# Patient Record
Sex: Female | Born: 1965 | Race: White | Hispanic: No | Marital: Married | Smoking: Never smoker
Health system: Southern US, Community
[De-identification: ages and names within clinical notes are randomized; demographics above are authoritative.]

## PROBLEM LIST (undated history)

## (undated) DIAGNOSIS — T8859XA Other complications of anesthesia, initial encounter: Secondary | ICD-10-CM

## (undated) DIAGNOSIS — J189 Pneumonia, unspecified organism: Secondary | ICD-10-CM

## (undated) DIAGNOSIS — Z9889 Other specified postprocedural states: Secondary | ICD-10-CM

## (undated) DIAGNOSIS — R112 Nausea with vomiting, unspecified: Secondary | ICD-10-CM

## (undated) DIAGNOSIS — D649 Anemia, unspecified: Secondary | ICD-10-CM

## (undated) DIAGNOSIS — T4145XA Adverse effect of unspecified anesthetic, initial encounter: Secondary | ICD-10-CM

## (undated) DIAGNOSIS — Z86010 Personal history of colonic polyps: Secondary | ICD-10-CM

## (undated) DIAGNOSIS — M069 Rheumatoid arthritis, unspecified: Secondary | ICD-10-CM

## (undated) HISTORY — DX: Personal history of colonic polyps: Z86.010

## (undated) HISTORY — DX: Rheumatoid arthritis, unspecified: M06.9

## (undated) HISTORY — DX: Anemia, unspecified: D64.9

---

## 1990-07-18 HISTORY — PX: DIAGNOSTIC LAPAROSCOPY: SUR761

## 1997-11-24 ENCOUNTER — Other Ambulatory Visit: Admission: RE | Admit: 1997-11-24 | Discharge: 1997-11-24 | Payer: Self-pay | Admitting: Gynecology

## 1998-11-25 ENCOUNTER — Other Ambulatory Visit: Admission: RE | Admit: 1998-11-25 | Discharge: 1998-11-25 | Payer: Self-pay | Admitting: Gynecology

## 1999-12-16 ENCOUNTER — Other Ambulatory Visit: Admission: RE | Admit: 1999-12-16 | Discharge: 1999-12-16 | Payer: Self-pay | Admitting: Gynecology

## 2000-01-26 ENCOUNTER — Other Ambulatory Visit: Admission: RE | Admit: 2000-01-26 | Discharge: 2000-01-26 | Payer: Self-pay | Admitting: Gynecology

## 2001-01-05 ENCOUNTER — Other Ambulatory Visit: Admission: RE | Admit: 2001-01-05 | Discharge: 2001-01-05 | Payer: Self-pay | Admitting: Gynecology

## 2002-03-21 ENCOUNTER — Other Ambulatory Visit: Admission: RE | Admit: 2002-03-21 | Discharge: 2002-03-21 | Payer: Self-pay | Admitting: Obstetrics & Gynecology

## 2002-07-08 ENCOUNTER — Ambulatory Visit (HOSPITAL_COMMUNITY): Admission: RE | Admit: 2002-07-08 | Discharge: 2002-07-08 | Payer: Self-pay | Admitting: Obstetrics and Gynecology

## 2002-07-18 HISTORY — PX: TUBAL LIGATION: SHX77

## 2002-09-25 ENCOUNTER — Inpatient Hospital Stay (HOSPITAL_COMMUNITY): Admission: AD | Admit: 2002-09-25 | Discharge: 2002-09-27 | Payer: Self-pay | Admitting: Obstetrics & Gynecology

## 2002-10-24 ENCOUNTER — Other Ambulatory Visit: Admission: RE | Admit: 2002-10-24 | Discharge: 2002-10-24 | Payer: Self-pay | Admitting: Obstetrics & Gynecology

## 2002-11-07 ENCOUNTER — Ambulatory Visit (HOSPITAL_COMMUNITY): Admission: RE | Admit: 2002-11-07 | Discharge: 2002-11-07 | Payer: Self-pay | Admitting: Obstetrics & Gynecology

## 2004-01-26 ENCOUNTER — Other Ambulatory Visit: Admission: RE | Admit: 2004-01-26 | Discharge: 2004-01-26 | Payer: Self-pay | Admitting: Obstetrics & Gynecology

## 2004-04-29 ENCOUNTER — Ambulatory Visit (HOSPITAL_COMMUNITY): Admission: RE | Admit: 2004-04-29 | Discharge: 2004-04-29 | Payer: Self-pay | Admitting: Family Medicine

## 2005-06-23 ENCOUNTER — Other Ambulatory Visit: Admission: RE | Admit: 2005-06-23 | Discharge: 2005-06-23 | Payer: Self-pay | Admitting: Obstetrics & Gynecology

## 2006-08-29 ENCOUNTER — Encounter: Admission: RE | Admit: 2006-08-29 | Discharge: 2006-08-29 | Payer: Self-pay | Admitting: Sports Medicine

## 2006-10-27 ENCOUNTER — Encounter: Admission: RE | Admit: 2006-10-27 | Discharge: 2006-10-27 | Payer: Self-pay | Admitting: Obstetrics & Gynecology

## 2010-08-11 ENCOUNTER — Encounter
Admission: RE | Admit: 2010-08-11 | Discharge: 2010-08-11 | Payer: Self-pay | Source: Home / Self Care | Attending: Obstetrics & Gynecology | Admitting: Obstetrics & Gynecology

## 2011-05-30 ENCOUNTER — Other Ambulatory Visit: Payer: Self-pay | Admitting: Obstetrics & Gynecology

## 2011-05-30 DIAGNOSIS — R2230 Localized swelling, mass and lump, unspecified upper limb: Secondary | ICD-10-CM

## 2011-06-14 ENCOUNTER — Ambulatory Visit
Admission: RE | Admit: 2011-06-14 | Discharge: 2011-06-14 | Disposition: A | Discharge status: Home / Self Care | Payer: BC Managed Care – PPO | Source: Ambulatory Visit | Attending: Obstetrics & Gynecology | Admitting: Obstetrics & Gynecology

## 2011-06-14 DIAGNOSIS — R2230 Localized swelling, mass and lump, unspecified upper limb: Secondary | ICD-10-CM

## 2012-01-13 ENCOUNTER — Other Ambulatory Visit: Payer: Self-pay | Admitting: Obstetrics & Gynecology

## 2012-01-13 DIAGNOSIS — N6009 Solitary cyst of unspecified breast: Secondary | ICD-10-CM

## 2012-01-20 ENCOUNTER — Other Ambulatory Visit: Payer: BC Managed Care – PPO

## 2012-01-23 ENCOUNTER — Ambulatory Visit
Admission: RE | Admit: 2012-01-23 | Discharge: 2012-01-23 | Disposition: A | Discharge status: Home / Self Care | Payer: BC Managed Care – PPO | Source: Ambulatory Visit | Attending: Obstetrics & Gynecology | Admitting: Obstetrics & Gynecology

## 2012-01-23 DIAGNOSIS — N6009 Solitary cyst of unspecified breast: Secondary | ICD-10-CM

## 2013-04-30 ENCOUNTER — Other Ambulatory Visit: Payer: Self-pay

## 2013-04-30 DIAGNOSIS — Z1231 Encounter for screening mammogram for malignant neoplasm of breast: Secondary | ICD-10-CM

## 2013-05-13 ENCOUNTER — Ambulatory Visit
Admission: RE | Admit: 2013-05-13 | Discharge: 2013-05-13 | Disposition: A | Discharge status: Home / Self Care | Payer: BC Managed Care – PPO | Source: Ambulatory Visit

## 2013-05-13 DIAGNOSIS — Z1231 Encounter for screening mammogram for malignant neoplasm of breast: Secondary | ICD-10-CM

## 2013-11-26 ENCOUNTER — Encounter: Payer: Self-pay | Admitting: Internal Medicine

## 2013-11-26 ENCOUNTER — Other Ambulatory Visit: Payer: Self-pay | Admitting: Obstetrics & Gynecology

## 2013-11-26 DIAGNOSIS — N63 Unspecified lump in unspecified breast: Secondary | ICD-10-CM

## 2013-12-06 ENCOUNTER — Ambulatory Visit
Admission: RE | Admit: 2013-12-06 | Discharge: 2013-12-06 | Disposition: A | Discharge status: Home / Self Care | Payer: BC Managed Care – PPO | Source: Ambulatory Visit | Attending: Obstetrics & Gynecology | Admitting: Obstetrics & Gynecology

## 2013-12-06 ENCOUNTER — Encounter (INDEPENDENT_AMBULATORY_CARE_PROVIDER_SITE_OTHER): Payer: Self-pay

## 2013-12-06 DIAGNOSIS — N63 Unspecified lump in unspecified breast: Secondary | ICD-10-CM

## 2014-01-02 ENCOUNTER — Ambulatory Visit (AMBULATORY_SURGERY_CENTER): Payer: Self-pay | Admitting: *Deleted

## 2014-01-02 VITALS — Ht 65.0 in | Wt 158.4 lb

## 2014-01-02 DIAGNOSIS — Z1211 Encounter for screening for malignant neoplasm of colon: Secondary | ICD-10-CM

## 2014-01-02 MED ORDER — NA SULFATE-K SULFATE-MG SULF 17.5-3.13-1.6 GM/177ML PO SOLN: 1.0000 | Freq: Once | ORAL | Status: DC

## 2014-01-02 NOTE — Progress Notes (Signed)
No allergies to eggs or soy. No problems with anesthesia.  Pt given Emmi instructions for colonoscopy  No oxygen use  No diet drug use  

## 2014-01-16 ENCOUNTER — Encounter: Payer: BC Managed Care – PPO | Admitting: Internal Medicine

## 2014-01-24 ENCOUNTER — Ambulatory Visit (AMBULATORY_SURGERY_CENTER): Payer: BC Managed Care – PPO | Admitting: Internal Medicine

## 2014-01-24 ENCOUNTER — Encounter: Payer: Self-pay | Admitting: Internal Medicine

## 2014-01-24 VITALS — BP 107/76 | HR 78 | Temp 97.1°F | Resp 21 | Ht 65.0 in | Wt 158.0 lb

## 2014-01-24 DIAGNOSIS — Z1211 Encounter for screening for malignant neoplasm of colon: Secondary | ICD-10-CM

## 2014-01-24 DIAGNOSIS — D126 Benign neoplasm of colon, unspecified: Secondary | ICD-10-CM

## 2014-01-24 DIAGNOSIS — D122 Benign neoplasm of ascending colon: Secondary | ICD-10-CM

## 2014-01-24 DIAGNOSIS — D125 Benign neoplasm of sigmoid colon: Secondary | ICD-10-CM

## 2014-01-24 MED ORDER — SODIUM CHLORIDE 0.9 % IV SOLN: 500.0000 mL | INTRAVENOUS | Status: DC

## 2014-01-24 NOTE — Patient Instructions (Addendum)
I found and removed 2 tiny polyps that look benign.  Everything else looks ok.  It sounds to me like you probably have Irritable Bowel Syndrome (IBS).  Try a probiotic every day for 1 month to see if that makes a difference. I recommend one called Align but there are many.  If you have persistent problems you may call my office and get an appointment to see me.  I appreciate the opportunity to care for you. Gatha Mayer, MD, FACG     YOU HAD AN ENDOSCOPIC PROCEDURE TODAY AT [facility]: Refer to the procedure report that was given to you for any specific questions about what was found during the examination.  If the procedure report does not answer your questions, please call your gastroenterologist to clarify.  If you requested that your care partner not be given the details of your procedure findings, then the procedure report has been included in a sealed envelope for you to review at your convenience later.  YOU SHOULD EXPECT: Some feelings of bloating in the abdomen. Passage of more gas than usual.  Walking can help get rid of the air that was put into your GI tract during the procedure and reduce the bloating. If you had a lower endoscopy (such as a colonoscopy or flexible sigmoidoscopy) you may notice spotting of blood in your stool or on the toilet paper. If you underwent a bowel prep for your procedure, then you may not have a normal bowel movement for a few days.  DIET: Your first meal following the procedure should be a light meal and then it is ok to progress to your normal diet.  A half-sandwich or bowl of soup is an example of a good first meal.  Heavy or fried foods are harder to digest and may make you feel nauseous or bloated.  Likewise meals heavy in dairy and vegetables can cause extra gas to form and this can also increase the bloating.  Drink plenty of fluids but you should avoid alcoholic beverages for 24 hours.  ACTIVITY: Your care partner should take  you home directly after the procedure.  You should plan to take it easy, moving slowly for the rest of the day.  You can resume normal activity the day after the procedure however you should NOT DRIVE or use heavy machinery for 24 hours (because of the sedation medicines used during the test).    SYMPTOMS TO REPORT IMMEDIATELY: A gastroenterologist can be reached at any hour.  During normal business hours, 8:30 AM to 5:00 PM Monday through Friday, call [phone].  After hours and on weekends, please call the GI answering service at [phone] who will take a message and have the physician on call contact you.   Following lower endoscopy (colonoscopy or flexible sigmoidoscopy):  Excessive amounts of blood in the stool  Significant tenderness or worsening of abdominal pains  Swelling of the abdomen that is new, acute  Fever of 100F or higher   FOLLOW UP: If any biopsies were taken you will be contacted by phone or by letter within the next 1-3 weeks.  Call your gastroenterologist if you have not heard about the biopsies in 3 weeks.  Our staff will call the home number listed on your records the next business day following your procedure to check on you and address any questions or concerns that you may have at that time regarding the information given to you following your procedure. This is a  courtesy call and so if there is no answer at the home number and we have not heard from you through the emergency physician on call, we will assume that you have returned to your regular daily activities without incident.  SIGNATURES/CONFIDENTIALITY: You and/or your care partner have signed paperwork which will be entered into your electronic medical record.  These signatures attest to the fact that that the information above on your After Visit Summary has been reviewed and is understood.  Full responsibility of the confidentiality of this discharge information lies with you and/or your  care-partner.    Handouts were given to your care partner on polyps and Irritable Bowel Syndrome. You may resume your other current medications today. Await biopsy results. Please call if any questions or concerns.

## 2014-01-24 NOTE — Op Note (Signed)
[place]  [address] [place], [address]   COLONOSCOPY PROCEDURE REPORT  PATIENT: Victoria Singh, Victoria Singh  MR#: [id number] BIRTHDATE: 03-Oct-1965 , 48  yrs. old GENDER: Female ENDOSCOPIST: Gatha Mayer, MD, [facility] REFERRED CW:CBJSEG Nori Riis, M.D. PROCEDURE DATE:  01/24/2014 PROCEDURE:   Colonoscopy with biopsy and snare polypectomy First Screening Colonoscopy - Avg.  risk and is 50 yrs.  old or older Yes.  Prior Negative Screening - Now for repeat screening. N/A  History of Adenoma - Now for follow-up colonoscopy & has been > or = to 3 yrs.  N/A  Polyps Removed Today? Yes. ASA CLASS:   Class II INDICATIONS:average risk screening and first colonoscopy. MEDICATIONS: MAC sedation, administered by CRNA, These medications were titrated to patient response per physician's verbal order, and propofol (Diprivan) 250mg  IV  DESCRIPTION OF PROCEDURE:   After the risks benefits and alternatives of the procedure were thoroughly explained, informed consent was obtained.  A digital rectal exam revealed no abnormalities of the rectum.   The LB [id number]  endoscope was introduced through the anus and advanced to the terminal ileum which was intubated for a short distance. No adverse events experienced.   The quality of the prep was excellent using Suprep The instrument was then slowly withdrawn as the colon was fully examined.  COLON FINDINGS: Two sessile polyps measuring 2 and 4 mm in size were found in the ascending colon and sigmoid colon.  A polypectomy was performed with cold forceps and with a cold snare.  The resection was complete and the polyp tissue was completely retrieved.   The colon mucosa was otherwise normal.   The mucosa appeared normal in the terminal ileum.   A right colon retroflexion was performed. Retroflexed views revealed no abnormalities. The time to cecum=3 minutes 16 seconds.  Withdrawal time=7 minutes 35 seconds.  The scope was withdrawn and the  procedure completed. COMPLICATIONS: There were no complications.  ENDOSCOPIC IMPRESSION: 1.   Two sessile polyps measuring 2 and 4 mm in size were found in the ascending colon and sigmoid colon; polypectomy was performed with cold forceps and with a cold snare 2.   The colon mucosa was otherwise normal - excellent prep 3.   Normal mucosa in the terminal ileum  RECOMMENDATIONS: 1.  Timing of repeat colonoscopy will be determined by pathology findings. 2.   Try probiotic daily x 1 month for what sounds like IBS, may see me if unhelpful  eSigned:  Gatha Mayer, MD, [facility] 01/24/2014 11:39 AM   cc: Evette Cristal, MD and The Patient

## 2014-01-24 NOTE — Progress Notes (Signed)
Procedure ends, to recovery, report given and VSS. 

## 2014-01-24 NOTE — Progress Notes (Signed)
Called to room to assist during endoscopic procedure.  Patient ID and intended procedure confirmed with present staff. Received instructions for my participation in the procedure from the performing physician.  

## 2014-01-27 ENCOUNTER — Telehealth: Payer: Self-pay | Admitting: *Deleted

## 2014-01-27 NOTE — Telephone Encounter (Signed)
  Follow up Call-  Call back number 01/24/2014  Post procedure Call Back phone  # [phone]  Permission to leave phone message Yes     Patient questions:  Message left to call us if necessary.

## 2014-01-30 ENCOUNTER — Encounter: Payer: Self-pay | Admitting: Internal Medicine

## 2014-01-30 DIAGNOSIS — Z860100 Personal history of colon polyps, unspecified: Secondary | ICD-10-CM

## 2014-01-30 HISTORY — DX: Personal history of colonic polyps: Z86.010

## 2014-01-30 HISTORY — DX: Personal history of colon polyps, unspecified: Z86.0100

## 2014-01-30 NOTE — Progress Notes (Signed)
Quick Note:  2 mm adenoma and a 4 mm sigmoid hyperplastic - repeat colon 2022 ______

## 2014-06-16 ENCOUNTER — Other Ambulatory Visit: Payer: Self-pay | Admitting: Obstetrics & Gynecology

## 2014-06-18 LAB — CYTOLOGY - PAP

## 2015-05-26 ENCOUNTER — Other Ambulatory Visit: Payer: Self-pay | Admitting: Obstetrics & Gynecology

## 2015-05-26 DIAGNOSIS — R928 Other abnormal and inconclusive findings on diagnostic imaging of breast: Secondary | ICD-10-CM

## 2015-06-03 ENCOUNTER — Ambulatory Visit
Admission: RE | Admit: 2015-06-03 | Discharge: 2015-06-03 | Disposition: A | Discharge status: Home / Self Care | Payer: BC Managed Care – PPO | Source: Ambulatory Visit | Attending: Obstetrics & Gynecology | Admitting: Obstetrics & Gynecology

## 2015-06-03 DIAGNOSIS — R928 Other abnormal and inconclusive findings on diagnostic imaging of breast: Secondary | ICD-10-CM

## 2016-05-27 ENCOUNTER — Other Ambulatory Visit: Payer: Self-pay | Admitting: Obstetrics & Gynecology

## 2016-05-27 DIAGNOSIS — Z1231 Encounter for screening mammogram for malignant neoplasm of breast: Secondary | ICD-10-CM

## 2016-07-04 ENCOUNTER — Ambulatory Visit
Admission: RE | Admit: 2016-07-04 | Discharge: 2016-07-04 | Disposition: A | Discharge status: Home / Self Care | Payer: BC Managed Care – PPO | Source: Ambulatory Visit | Attending: Obstetrics & Gynecology | Admitting: Obstetrics & Gynecology

## 2016-07-04 DIAGNOSIS — Z1231 Encounter for screening mammogram for malignant neoplasm of breast: Secondary | ICD-10-CM

## 2017-06-14 ENCOUNTER — Other Ambulatory Visit: Payer: Self-pay | Admitting: Obstetrics & Gynecology

## 2017-06-14 DIAGNOSIS — Z139 Encounter for screening, unspecified: Secondary | ICD-10-CM

## 2017-07-12 ENCOUNTER — Ambulatory Visit: Payer: BC Managed Care – PPO

## 2017-07-19 ENCOUNTER — Ambulatory Visit
Admission: RE | Admit: 2017-07-19 | Discharge: 2017-07-19 | Disposition: A | Discharge status: Home / Self Care | Payer: BC Managed Care – PPO | Source: Ambulatory Visit | Attending: Obstetrics & Gynecology | Admitting: Obstetrics & Gynecology

## 2017-07-19 DIAGNOSIS — Z139 Encounter for screening, unspecified: Secondary | ICD-10-CM

## 2017-12-10 ENCOUNTER — Encounter (HOSPITAL_COMMUNITY): Payer: Self-pay

## 2017-12-10 ENCOUNTER — Inpatient Hospital Stay (HOSPITAL_COMMUNITY): Payer: BC Managed Care – PPO

## 2017-12-10 ENCOUNTER — Inpatient Hospital Stay (HOSPITAL_COMMUNITY): Payer: BC Managed Care – PPO | Admitting: Anesthesiology

## 2017-12-10 ENCOUNTER — Inpatient Hospital Stay (HOSPITAL_COMMUNITY)
Admission: EM | Admit: 2017-12-10 | Discharge: 2017-12-14 | DRG: 853 | Disposition: A | Discharge status: Home / Self Care | Payer: BC Managed Care – PPO | Attending: Family Medicine | Admitting: Family Medicine

## 2017-12-10 ENCOUNTER — Emergency Department (HOSPITAL_COMMUNITY): Payer: BC Managed Care – PPO

## 2017-12-10 ENCOUNTER — Encounter (HOSPITAL_COMMUNITY)
Admission: EM | Disposition: A | Discharge status: Home / Self Care | Payer: Self-pay | Source: Home / Self Care | Attending: Urology

## 2017-12-10 DIAGNOSIS — D849 Immunodeficiency, unspecified: Secondary | ICD-10-CM

## 2017-12-10 DIAGNOSIS — J181 Lobar pneumonia, unspecified organism: Secondary | ICD-10-CM | POA: Diagnosis present

## 2017-12-10 DIAGNOSIS — Z419 Encounter for procedure for purposes other than remedying health state, unspecified: Secondary | ICD-10-CM

## 2017-12-10 DIAGNOSIS — J189 Pneumonia, unspecified organism: Secondary | ICD-10-CM | POA: Diagnosis not present

## 2017-12-10 DIAGNOSIS — J9811 Atelectasis: Secondary | ICD-10-CM | POA: Diagnosis present

## 2017-12-10 DIAGNOSIS — R0781 Pleurodynia: Secondary | ICD-10-CM | POA: Diagnosis not present

## 2017-12-10 DIAGNOSIS — E8770 Fluid overload, unspecified: Secondary | ICD-10-CM | POA: Diagnosis present

## 2017-12-10 DIAGNOSIS — Z86010 Personal history of colonic polyps: Secondary | ICD-10-CM | POA: Diagnosis not present

## 2017-12-10 DIAGNOSIS — N136 Pyonephrosis: Secondary | ICD-10-CM | POA: Diagnosis present

## 2017-12-10 DIAGNOSIS — A4151 Sepsis due to Escherichia coli [E. coli]: Principal | ICD-10-CM | POA: Diagnosis present

## 2017-12-10 DIAGNOSIS — M0579 Rheumatoid arthritis with rheumatoid factor of multiple sites without organ or systems involvement: Secondary | ICD-10-CM | POA: Diagnosis present

## 2017-12-10 DIAGNOSIS — I959 Hypotension, unspecified: Secondary | ICD-10-CM | POA: Diagnosis present

## 2017-12-10 DIAGNOSIS — R0602 Shortness of breath: Secondary | ICD-10-CM

## 2017-12-10 DIAGNOSIS — Z888 Allergy status to other drugs, medicaments and biological substances status: Secondary | ICD-10-CM | POA: Diagnosis not present

## 2017-12-10 DIAGNOSIS — N10 Acute tubulo-interstitial nephritis: Secondary | ICD-10-CM | POA: Diagnosis present

## 2017-12-10 DIAGNOSIS — N12 Tubulo-interstitial nephritis, not specified as acute or chronic: Secondary | ICD-10-CM | POA: Diagnosis present

## 2017-12-10 DIAGNOSIS — Z882 Allergy status to sulfonamides status: Secondary | ICD-10-CM

## 2017-12-10 DIAGNOSIS — D649 Anemia, unspecified: Secondary | ICD-10-CM | POA: Diagnosis present

## 2017-12-10 DIAGNOSIS — J90 Pleural effusion, not elsewhere classified: Secondary | ICD-10-CM | POA: Diagnosis present

## 2017-12-10 DIAGNOSIS — Z79899 Other long term (current) drug therapy: Secondary | ICD-10-CM | POA: Diagnosis not present

## 2017-12-10 DIAGNOSIS — Z87442 Personal history of urinary calculi: Secondary | ICD-10-CM | POA: Diagnosis not present

## 2017-12-10 DIAGNOSIS — Z87440 Personal history of urinary (tract) infections: Secondary | ICD-10-CM

## 2017-12-10 DIAGNOSIS — N200 Calculus of kidney: Secondary | ICD-10-CM | POA: Insufficient documentation

## 2017-12-10 DIAGNOSIS — D899 Disorder involving the immune mechanism, unspecified: Secondary | ICD-10-CM

## 2017-12-10 HISTORY — PX: CYSTOSCOPY W/ URETERAL STENT PLACEMENT: SHX1429

## 2017-12-10 LAB — I-STAT CG4 LACTIC ACID, ED: LACTIC ACID, VENOUS: 1.8 mmol/L (ref 0.5–1.9)

## 2017-12-10 LAB — CBC
HEMATOCRIT: 42 % (ref 36.0–46.0)
HEMOGLOBIN: 13.6 g/dL (ref 12.0–15.0)
MCH: 28.2 pg (ref 26.0–34.0)
MCHC: 32.4 g/dL (ref 30.0–36.0)
MCV: 87 fL (ref 78.0–100.0)
Platelets: 177 10*3/uL (ref 150–400)
RBC: 4.83 MIL/uL (ref 3.87–5.11)
RDW: 12.7 % (ref 11.5–15.5)
WBC: 10.8 10*3/uL — AB (ref 4.0–10.5)

## 2017-12-10 LAB — GRAM STAIN

## 2017-12-10 LAB — URINALYSIS, ROUTINE W REFLEX MICROSCOPIC
BILIRUBIN URINE: NEGATIVE
Glucose, UA: NEGATIVE mg/dL
Hgb urine dipstick: NEGATIVE
KETONES UR: NEGATIVE mg/dL
NITRITE: NEGATIVE
PH: 6 (ref 5.0–8.0)
Protein, ur: NEGATIVE mg/dL
Specific Gravity, Urine: 1.02 (ref 1.005–1.030)

## 2017-12-10 LAB — I-STAT BETA HCG BLOOD, ED (MC, WL, AP ONLY): I-stat hCG, quantitative: <5 m[IU]/mL (ref <5)

## 2017-12-10 LAB — COMPREHENSIVE METABOLIC PANEL
ALT: 18 U/L (ref 14–54)
ANION GAP: 9 (ref 5–15)
AST: 24 U/L (ref 15–41)
Albumin: 4 g/dL (ref 3.5–5.0)
Alkaline Phosphatase: 72 U/L (ref 38–126)
BUN: 8 mg/dL (ref 6–20)
CHLORIDE: 104 mmol/L (ref 101–111)
CO2: 24 mmol/L (ref 22–32)
Calcium: 9 mg/dL (ref 8.9–10.3)
Creatinine, Ser: 0.69 mg/dL (ref 0.44–1.00)
GLUCOSE: 122 mg/dL — AB (ref 65–99)
POTASSIUM: 3.7 mmol/L (ref 3.5–5.1)
Sodium: 137 mmol/L (ref 135–145)
TOTAL PROTEIN: 7 g/dL (ref 6.5–8.1)
Total Bilirubin: 0.8 mg/dL (ref 0.3–1.2)

## 2017-12-10 LAB — LIPASE, BLOOD: LIPASE: 30 U/L (ref 11–51)

## 2017-12-10 SURGERY — CYSTOSCOPY, WITH RETROGRADE PYELOGRAM AND URETERAL STENT INSERTION
Anesthesia: General | Laterality: Left

## 2017-12-10 MED ORDER — POLYETHYLENE GLYCOL 3350 17 G PO PACK: 17.0000 g | PACK | Freq: Every day | ORAL | Status: DC | PRN

## 2017-12-10 MED ORDER — FENTANYL CITRATE (PF) 100 MCG/2ML IJ SOLN: 25.0000 ug | INTRAMUSCULAR | Status: DC | PRN

## 2017-12-10 MED ORDER — ONDANSETRON HCL 4 MG PO TABS
4.0000 mg | ORAL_TABLET | Freq: Four times a day (QID) | ORAL | Status: DC | PRN
  Administered 2017-12-13 – 2017-12-14 (×2): 4 mg via ORAL
  Filled 2017-12-10 (×2): qty 1

## 2017-12-10 MED ORDER — VITAMIN B-12 1000 MCG PO TABS
1000.0000 ug | ORAL_TABLET | Freq: Every day | ORAL | Status: DC
Start: 2017-12-11 — End: 2017-12-14
  Administered 2017-12-11 – 2017-12-14 (×4): 1000 ug via ORAL
  Filled 2017-12-10 (×4): qty 1

## 2017-12-10 MED ORDER — MIDAZOLAM HCL 2 MG/2ML IJ SOLN
INTRAMUSCULAR | Status: AC
  Filled 2017-12-10: qty 2

## 2017-12-10 MED ORDER — IOPAMIDOL (ISOVUE-300) INJECTION 61%
INTRAVENOUS | Status: DC | PRN
  Administered 2017-12-10: 7 mL via URETHRAL

## 2017-12-10 MED ORDER — KETOROLAC TROMETHAMINE 30 MG/ML IJ SOLN
30.0000 mg | Freq: Four times a day (QID) | INTRAMUSCULAR | Status: DC | PRN
  Administered 2017-12-11: 30 mg via INTRAVENOUS
  Filled 2017-12-10: qty 1

## 2017-12-10 MED ORDER — ACETAMINOPHEN 650 MG RE SUPP: 650.0000 mg | Freq: Four times a day (QID) | RECTAL | Status: DC | PRN

## 2017-12-10 MED ORDER — FENTANYL CITRATE (PF) 100 MCG/2ML IJ SOLN
INTRAMUSCULAR | Status: DC | PRN
  Administered 2017-12-10 (×2): 50 ug via INTRAVENOUS

## 2017-12-10 MED ORDER — SODIUM CHLORIDE 0.9 % IV BOLUS
1000.0000 mL | Freq: Once | INTRAVENOUS | Status: AC
  Administered 2017-12-10: 1000 mL via INTRAVENOUS

## 2017-12-10 MED ORDER — STERILE WATER FOR IRRIGATION IR SOLN
Status: DC | PRN
Start: 2017-12-10 — End: 2017-12-10
  Administered 2017-12-10: 3000 mL

## 2017-12-10 MED ORDER — LEFLUNOMIDE 20 MG PO TABS
10.0000 mg | ORAL_TABLET | Freq: Every day | ORAL | Status: DC
  Filled 2017-12-10: qty 0.5

## 2017-12-10 MED ORDER — FOLIC ACID 1 MG PO TABS
1.0000 mg | ORAL_TABLET | Freq: Every day | ORAL | Status: DC
  Administered 2017-12-11 – 2017-12-14 (×4): 1 mg via ORAL
  Filled 2017-12-10 (×4): qty 1

## 2017-12-10 MED ORDER — MORPHINE SULFATE (PF) 4 MG/ML IV SOLN
4.0000 mg | Freq: Once | INTRAVENOUS | Status: AC
  Administered 2017-12-10: 4 mg via INTRAVENOUS
  Filled 2017-12-10: qty 1

## 2017-12-10 MED ORDER — ACETAMINOPHEN 325 MG PO TABS
650.0000 mg | ORAL_TABLET | Freq: Four times a day (QID) | ORAL | Status: DC | PRN
  Administered 2017-12-11 – 2017-12-12 (×2): 650 mg via ORAL
  Filled 2017-12-10 (×2): qty 2

## 2017-12-10 MED ORDER — IOPAMIDOL (ISOVUE-300) INJECTION 61%
INTRAVENOUS | Status: AC
  Filled 2017-12-10: qty 50

## 2017-12-10 MED ORDER — SODIUM CHLORIDE 0.9 % IR SOLN
Status: DC | PRN
  Administered 2017-12-10: 1000 mL

## 2017-12-10 MED ORDER — OXYCODONE HCL 5 MG PO TABS: 5.0000 mg | ORAL_TABLET | Freq: Once | ORAL | Status: DC | PRN

## 2017-12-10 MED ORDER — ACETAMINOPHEN 500 MG PO TABS
1000.0000 mg | ORAL_TABLET | Freq: Once | ORAL | Status: AC
  Administered 2017-12-10: 1000 mg via ORAL
  Filled 2017-12-10: qty 2

## 2017-12-10 MED ORDER — ENOXAPARIN SODIUM 40 MG/0.4ML ~~LOC~~ SOLN
40.0000 mg | SUBCUTANEOUS | Status: DC
  Administered 2017-12-11 – 2017-12-14 (×4): 40 mg via SUBCUTANEOUS
  Filled 2017-12-10 (×5): qty 0.4

## 2017-12-10 MED ORDER — MORPHINE SULFATE (PF) 4 MG/ML IV SOLN
4.0000 mg | INTRAVENOUS | Status: DC | PRN
  Administered 2017-12-12: 4 mg via INTRAVENOUS
  Filled 2017-12-10: qty 1

## 2017-12-10 MED ORDER — ONDANSETRON 4 MG PO TBDP
4.0000 mg | ORAL_TABLET | Freq: Once | ORAL | Status: AC | PRN
  Administered 2017-12-10: 4 mg via ORAL
  Filled 2017-12-10: qty 1

## 2017-12-10 MED ORDER — ONDANSETRON HCL 4 MG/2ML IJ SOLN
4.0000 mg | Freq: Once | INTRAMUSCULAR | Status: AC
  Administered 2017-12-10: 4 mg via INTRAVENOUS
  Filled 2017-12-10: qty 2

## 2017-12-10 MED ORDER — DEXAMETHASONE SODIUM PHOSPHATE 10 MG/ML IJ SOLN
INTRAMUSCULAR | Status: DC | PRN
  Administered 2017-12-10: 5 mg via INTRAVENOUS

## 2017-12-10 MED ORDER — SUCCINYLCHOLINE CHLORIDE 20 MG/ML IJ SOLN
INTRAMUSCULAR | Status: DC | PRN
  Administered 2017-12-10: 120 mg via INTRAVENOUS

## 2017-12-10 MED ORDER — FENTANYL CITRATE (PF) 250 MCG/5ML IJ SOLN
INTRAMUSCULAR | Status: AC
  Filled 2017-12-10: qty 5

## 2017-12-10 MED ORDER — PROMETHAZINE HCL 25 MG/ML IJ SOLN: 6.2500 mg | INTRAMUSCULAR | Status: DC | PRN

## 2017-12-10 MED ORDER — ADULT MULTIVITAMIN W/MINERALS CH
1.0000 | ORAL_TABLET | Freq: Every day | ORAL | Status: DC
  Administered 2017-12-11 – 2017-12-14 (×4): 1 via ORAL
  Filled 2017-12-10 (×4): qty 1

## 2017-12-10 MED ORDER — SODIUM CHLORIDE 0.9 % IV SOLN
INTRAVENOUS | Status: DC
  Administered 2017-12-10 – 2017-12-12 (×7): via INTRAVENOUS

## 2017-12-10 MED ORDER — NOREPINEPHRINE 4 MG/250ML-% IV SOLN
0.0000 ug/min | INTRAVENOUS | Status: DC
  Administered 2017-12-11: 2 ug/min via INTRAVENOUS
  Filled 2017-12-10: qty 250

## 2017-12-10 MED ORDER — CRANBERRY 300 MG PO TABS: 450.0000 mg | ORAL_TABLET | Freq: Every day | ORAL | Status: DC

## 2017-12-10 MED ORDER — OXYCODONE HCL 5 MG/5ML PO SOLN: 5.0000 mg | Freq: Once | ORAL | Status: DC | PRN

## 2017-12-10 MED ORDER — LIDOCAINE HCL (CARDIAC) PF 100 MG/5ML IV SOSY
PREFILLED_SYRINGE | INTRAVENOUS | Status: DC | PRN
  Administered 2017-12-10: 80 mg via INTRAVENOUS

## 2017-12-10 MED ORDER — ONDANSETRON HCL 4 MG/2ML IJ SOLN
INTRAMUSCULAR | Status: DC | PRN
  Administered 2017-12-10: 4 mg via INTRAVENOUS

## 2017-12-10 MED ORDER — SODIUM CHLORIDE 0.9 % IV SOLN
1.0000 g | Freq: Once | INTRAVENOUS | Status: AC
  Administered 2017-12-10: 1 g via INTRAVENOUS
  Filled 2017-12-10: qty 10

## 2017-12-10 MED ORDER — FENTANYL CITRATE (PF) 100 MCG/2ML IJ SOLN
50.0000 ug | INTRAMUSCULAR | Status: DC | PRN
  Administered 2017-12-10: 50 ug via NASAL
  Filled 2017-12-10: qty 2

## 2017-12-10 MED ORDER — PROPOFOL 10 MG/ML IV BOLUS
INTRAVENOUS | Status: AC
  Filled 2017-12-10: qty 20

## 2017-12-10 MED ORDER — KETOROLAC TROMETHAMINE 30 MG/ML IJ SOLN
30.0000 mg | Freq: Once | INTRAMUSCULAR | Status: AC
  Administered 2017-12-10: 30 mg via INTRAVENOUS
  Filled 2017-12-10: qty 1

## 2017-12-10 MED ORDER — ONDANSETRON HCL 4 MG/2ML IJ SOLN
4.0000 mg | Freq: Four times a day (QID) | INTRAMUSCULAR | Status: DC | PRN
  Administered 2017-12-12 – 2017-12-13 (×4): 4 mg via INTRAVENOUS
  Filled 2017-12-10 (×4): qty 2

## 2017-12-10 MED ORDER — PROPOFOL 10 MG/ML IV BOLUS
INTRAVENOUS | Status: DC | PRN
  Administered 2017-12-10: 90 mg via INTRAVENOUS
  Administered 2017-12-10: 30 mg via INTRAVENOUS

## 2017-12-10 MED ORDER — ORAL CARE MOUTH RINSE
15.0000 mL | Freq: Two times a day (BID) | OROMUCOSAL | Status: DC
  Administered 2017-12-10 – 2017-12-11 (×2): 15 mL via OROMUCOSAL

## 2017-12-10 MED ORDER — PHENYLEPHRINE HCL 10 MG/ML IJ SOLN
INTRAMUSCULAR | Status: DC | PRN
  Administered 2017-12-10: 50 ug/min via INTRAVENOUS

## 2017-12-10 SURGICAL SUPPLY — 40 items
ADAPTER CATH URET PLST 4-6FR (CATHETERS) IMPLANT
ADPR CATH URET STRL DISP 4-6FR (CATHETERS)
APL SKNCLS STERI-STRIP NONHPOA (GAUZE/BANDAGES/DRESSINGS)
BAG URINE DRAINAGE (UROLOGICAL SUPPLIES) ×2 IMPLANT
BENZOIN TINCTURE PRP APPL 2/3 (GAUZE/BANDAGES/DRESSINGS) IMPLANT
BLADE 10 SAFETY STRL DISP (BLADE) ×2 IMPLANT
BLADE CLIPPER SURG (BLADE) IMPLANT
BUCKET BIOHAZARD WASTE 5 GAL (MISCELLANEOUS) ×2 IMPLANT
CATH FOLEY 2WAY SLVR  5CC 16FR (CATHETERS) ×1
CATH FOLEY 2WAY SLVR 5CC 16FR (CATHETERS) ×1 IMPLANT
CATH INTERMIT  6FR 70CM (CATHETERS) ×2 IMPLANT
CATH URET 5FR 28IN CONE TIP (BALLOONS)
CATH URET 5FR 28IN OPEN ENDED (CATHETERS) ×2 IMPLANT
CATH URET 5FR 70CM CONE TIP (BALLOONS) IMPLANT
COVER SURGICAL LIGHT HANDLE (MISCELLANEOUS) ×2 IMPLANT
DRAPE C-ARM 42X72 X-RAY (DRAPES) ×2 IMPLANT
DRAPE CAMERA CLOSED 9X96 (DRAPES) ×4 IMPLANT
GLOVE BIO SURGEON STRL SZ7.5 (GLOVE) ×2 IMPLANT
GLOVE BIOGEL PI IND STRL 7.0 (GLOVE) ×2 IMPLANT
GLOVE BIOGEL PI INDICATOR 7.0 (GLOVE) ×2
GLOVE SURG SS PI 7.0 STRL IVOR (GLOVE) ×2 IMPLANT
GOWN STRL REUS W/ TWL XL LVL3 (GOWN DISPOSABLE) ×2 IMPLANT
GOWN STRL REUS W/TWL XL LVL3 (GOWN DISPOSABLE) ×4
GUIDEWIRE ANG ZIPWIRE 038X150 (WIRE) ×2 IMPLANT
GUIDEWIRE COOK  .035 (WIRE) IMPLANT
GUIDEWIRE STR DUAL SENSOR (WIRE) IMPLANT
H R LUBE JELLY XXX (MISCELLANEOUS) ×2 IMPLANT
KIT TURNOVER KIT B (KITS) ×2 IMPLANT
NS IRRIG 1000ML POUR BTL (IV SOLUTION) ×2 IMPLANT
PACK CYSTO (CUSTOM PROCEDURE TRAY) ×2 IMPLANT
PAD ARMBOARD 7.5X6 YLW CONV (MISCELLANEOUS) ×4 IMPLANT
PLUG CATH AND CAP STER (CATHETERS) IMPLANT
STENT URET 6FRX24 CONTOUR (STENTS) IMPLANT
STENT URET 6FRX26 CONTOUR (STENTS) ×2 IMPLANT
SYR 20CC LL (SYRINGE) ×2 IMPLANT
SYR CONTROL 10ML LL (SYRINGE) ×2 IMPLANT
SYRINGE TOOMEY DISP (SYRINGE) IMPLANT
UNDERPAD 30X30 (UNDERPADS AND DIAPERS) ×2 IMPLANT
WATER STERILE IRR 1000ML POUR (IV SOLUTION) ×2 IMPLANT
WIRE COONS/BENSON .038X145CM (WIRE) IMPLANT

## 2017-12-10 NOTE — Progress Notes (Signed)
[facility] Admission History and Physical Service Pager: [phone]  Patient name: Victoria Singh Medical record number: [id number] Date of birth: July 09, 1966 Age: 52 y.o. Gender: female  Primary Care Provider: Maisie Fus, MD Consultants: urology Code Status: full  Chief Complaint: left flank pain  Assessment and Plan: Victoria Singh is a 52 y.o. female presenting with left flank pain . PMH is significant for rheumatoid arthritis on methotrexate, anemia.  Left flank pain- secondary to renal stone and infection. Renal stone study demonstrates 3 mm stone in left distal ureter with perinephric stranding and moderate hydronephrosis. Urology consulted in ED with plans to stent ureter. ED provider initiated sepsis protocol given tachycardia, fever, source of infection. UA with leukocytes, Gram stain w/ multiple bacteria seen. Lactic acid 1.8, WBC count 10.8. Patient without IV access at time of interview but IV team on the way. S/p 1 L NS bolus but continues to look dry and remains tachycardic. -admit to inpatient, attending Dr. Mingo Amber -patient stable for floor, vitals per floor routine -continue CTX, await urine cx -blood cx pending as well -monitor for fevers, worsening illness -once IV established proceed with second NS bolus -then continue IVF at 100/hr -tylenol as needed for fever, pain -morphine 4 mg q4 hours as needed -trend lactic acid  Rheumatoid arthritis- on methotrexate weekly along with folic acid, L79 and leflunomide daily -continue home meds -monitor CBC  Hx of Anemia- secondary to MTX use as above, hemoglobin normal at 13.6 -monitor CBC  FEN/GI: NPO for procedure then regular diet Prophylaxis: lovenox  Disposition: admit to inpatient for stent and antibiotics  History of Present Illness:  Victoria Singh is a 52 y.o. female presenting with flank pain.   Patient reports she was in her usual state of health yesterday however felt tired  last night earlier than usual and went to bed. Awoke at 6 am with left sided pain that was severe. She went to bathroom and had bowel movement and normal urine output without any pain or blood noted. She continued to feel poorly and have pain. She took tylenol with no relief. She was trying to drink water but was unable to keep it down without vomiting, so she came to the ED. In ED was found to have left sided uretal stone with evidence of perinephric stranding. She became tachycardic and febrile.   Review Of Systems: Per HPI with the following additions:   Review of Systems  Constitutional: Positive for chills and fever. Negative for diaphoresis, malaise/fatigue and weight loss.  HENT: Negative for congestion and sore throat.   Eyes: Negative for blurred vision and double vision.  Respiratory: Negative for cough, shortness of breath and wheezing.   Cardiovascular: Negative for chest pain and palpitations.  Gastrointestinal: Positive for nausea and vomiting. Negative for abdominal pain, blood in stool, constipation, diarrhea, heartburn and melena.  Genitourinary: Positive for flank pain. Negative for dysuria, frequency, hematuria and urgency.  Musculoskeletal: Negative for back pain, falls and myalgias.  Skin: Negative for rash.  Neurological: Negative for focal weakness, seizures, weakness and headaches.  Psychiatric/Behavioral: Negative for substance abuse.    Patient Active Problem List   Diagnosis Date Noted  . Pyelonephritis 12/10/2017  . Nephrolithiasis   . Rheumatoid arthritis involving multiple sites with positive rheumatoid factor ([place])   . Immunocompromised ([place])   . Personal history of colonic polyp - adenoma 01/30/2014    Past Medical History: Past Medical History:  Diagnosis Date  . Anemia   .  Personal history of colonic polyp - adenoma 01/30/2014  . Rheumatoid arthritis [facility])     Past Surgical History: Past Surgical History:  Procedure Laterality Date  . DIAGNOSTIC  LAPAROSCOPY  1992  . TUBAL LIGATION  2004    Social History: Social History   Tobacco Use  . Smoking status: Never Smoker  . Smokeless tobacco: Never Used  Substance Use Topics  . Alcohol use: No  . Drug use: No   Additional social history: lives with husband and daughter  Please also refer to relevant sections of EMR.  Family History: Family History  Problem Relation Age of Onset  . Colon cancer Neg Hx    Allergies and Medications: Allergies  Allergen Reactions  . Nitrofurantoin Anaphylaxis and Itching  . Sulfa Antibiotics Itching and Swelling   No current facility-administered medications on file prior to encounter.    Current Outpatient Medications on File Prior to Encounter  Medication Sig Dispense Refill  . acetaminophen (TYLENOL) 500 MG tablet Take 1,000 mg by mouth as needed for mild pain.    Marland Kitchen CRANBERRY PO Take by mouth daily.    . Cyanocobalamin (B-12 PO) Take by mouth daily.    . folic acid (FOLVITE) 1 MG tablet Take 1 mg by mouth daily.    Marland Kitchen FOLIC ACID PO Take by mouth daily.    Marland Kitchen leflunomide (ARAVA) 10 MG tablet Take 10 mg by mouth daily.    . methotrexate (RHEUMATREX) 2.5 MG tablet Take 7.5 mg by mouth once a week. Caution:Chemotherapy. Protect from light. Takes 8 tablets once a week     . Multiple Vitamin (MULTIVITAMIN) tablet Take 1 tablet by mouth daily.      Objective: BP 125/71   Pulse (!) 122   Temp (!) 104.4 F (40.2 C) (Rectal)   Resp 16   LMP 01/04/2014   SpO2 92%  Exam: General: pleasant lady laying in bed in NAD Eyes: PERRL, EOMI ENTM: dry mucous membranes Neck: supple, non-tender, no LAD Cardiovascular: tachycardic, regular rhythm, no MRG Respiratory: CTAB. Normal work of breathing. Gastrointestinal: soft, non-tender, non-distended, no CVA tenderness MSK: moves all extremities equally, no edema or cyanosis Derm: skin is warm to touch, no rashes or lesions Neuro: no focal deficits Psych: mood is calm, affect appropriate  Labs and  Imaging: CBC BMET  Recent Labs  Lab 12/10/17 1229  WBC 10.8*  HGB 13.6  HCT 42.0  PLT 177   Recent Labs  Lab 12/10/17 1229  NA 137  K 3.7  CL 104  CO2 24  BUN 8  CREATININE 0.69  GLUCOSE 122*  CALCIUM 9.0     Ct Renal Stone Study  Result Date: 12/10/2017 CLINICAL DATA:  Acute left flank pain. EXAM: CT ABDOMEN AND PELVIS WITHOUT CONTRAST TECHNIQUE: Multidetector CT imaging of the abdomen and pelvis was performed following the standard protocol without IV contrast. COMPARISON:  None. FINDINGS: Lower chest: No acute abnormality. Hepatobiliary: No focal liver abnormality is seen. No gallstones, gallbladder wall thickening, or biliary dilatation. Pancreas: Unremarkable. No pancreatic ductal dilatation or surrounding inflammatory changes. Spleen: Normal in size without focal abnormality. Adrenals/Urinary Tract: Adrenal glands appear normal. Right kidney and ureter appear normal. Moderate left hydroureteronephrosis with perinephric stranding is noted secondary to 3 mm calculus at left ureterovesical junction. Urinary bladder is otherwise unremarkable. Stomach/Bowel: Stomach is within normal limits. Appendix appears normal. No evidence of bowel wall thickening, distention, or inflammatory changes. Vascular/Lymphatic: No significant vascular findings are present. No enlarged abdominal or pelvic lymph nodes. Reproductive:  Uterus and bilateral adnexa are unremarkable. Other: Small fat containing left inguinal hernia is noted. No ascites is noted. Musculoskeletal: No acute or significant osseous findings. IMPRESSION: Moderate left hydronephrosis with perinephric stranding is noted secondary to 3 mm distal left ureterovesical junction calculus. Electronically Signed   By: Marijo Conception, M.D.   On: 12/10/2017 15:31     Steve Rattler, DO 12/10/2017, 6:46 PM PGY-2, [facility] pager: [phone], text pages welcome

## 2017-12-10 NOTE — ED Notes (Signed)
IV Team in room at this time. 

## 2017-12-10 NOTE — ED Provider Notes (Signed)
I assumed care of the patient at around 4 PM.  The patient presented with abdominal pain.  Her pain started this morning.  She had had some associated nausea and vomiting.  No history of kidney stones.  Her work-up revealed a left-sided distal 3 mm stone at her UVJ.  Her urinalysis did not appear concerning for infection.  The patient has had poor pain control here.  The plan is to continue treating her pain and reevaluate.  On my initial assessment, the patient is tachycardic and warm to the touch.  Her pain is improved.  Rectal temperature obtained and her temperature is 104.4.  I reviewed the CT scan.  There is some left perinephric stranding.  Although the urinalysis is shows small leukoesterase and rare bacteria, I am concerned about possible pyelonephritis.  Will obtain Gram stain and culture.  Patient was given Rocephin empirically.  I consulted urology regarding the obstructing stone and concern for upstream infection.  Urology plans to take the patient for stent placement.  Patient is being admitted to the hospitalist for further management.   Clifton James, MD 12/11/17 [place], [place], MD 12/11/17 [phone]

## 2017-12-10 NOTE — ED Notes (Signed)
Patient transported to CT 

## 2017-12-10 NOTE — Consult Note (Signed)
Reason for Consult: Left Ureteral Stone, Urosepsis / Obstructing Pyelonephritis  Referring Physician: Orlie Dakin MD  Victoria Singh is an 52 y.o. female.   HPI:  1 - Left Ureteral Stone - 32m left distal stone with mod hydro by ER CT 11/2017 on eval flank pain. Stone is solitary, 331mat UVJ.  2 - Urosepsis / Obstructing Pyelonephritis - new fevers to 104 with bacteruria during ER eval for left ureeral stone. Sig perinephric stranding but no gas in renal parnechyma or peri-nephric abscess. 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 UCX pending, placed on empiric rocephin.   PMH sig for RA on methotrexate, tubal, lap ovarian cyst excision. NO blood thinners or ischemic CV disease. She gets most primary care through her GYN Dr. NeNori RiisDoes see rheumatology.   Today "Victoria Singh seen as emergent consult for left ureteral stone with evolving sepsis. Last meal yesterday, few sips of water 1.5 hrs ago.   Past Medical History:  Diagnosis Date  . Anemia   . Personal history of colonic polyp - adenoma 01/30/2014  . [facility]    Past Surgical History:  Procedure Laterality Date  . DIAGNOSTIC LAPAROSCOPY  1992  . TUBAL LIGATION  2004    Family History  Problem Relation Age of Onset  . Colon cancer Neg Hx     Social History:  reports that she has never smoked. She has never used smokeless tobacco. She reports that she does not drink alcohol or use drugs.  Allergies:  Allergies  Allergen Reactions  . Nitrofurantoin Anaphylaxis and Itching  . Sulfa Antibiotics Itching and Swelling    Medications: I have reviewed the patient's current medications.  Results for orders placed or performed during the hospital encounter of 12/10/17 (from the past 48 hour(s))  Lipase, blood     Status: None   Collection Time: 12/10/17 12:29 PM  Result Value Ref Range   Lipase 30 11 - 51 U/L    Comment: Performed at [facility]  [place] [id number] metabolic panel     Status: Abnormal   Collection Time: 12/10/17 12:29 PM  Result Value Ref Range   Sodium 137 135 - 145 mmol/L   Potassium 3.7 3.5 - 5.1 mmol/L   Chloride 104 101 - 111 mmol/L   CO2 24 22 - 32 mmol/L   Glucose, Bld 122 (H) 65 - 99 mg/dL   BUN 8 6 - 20 mg/dL   Creatinine, Ser 0.69 0.44 - 1.00 mg/dL   Calcium 9.0 8.9 - 10.3 mg/dL   Total Protein 7.0 6.5 - 8.1 g/dL   Albumin 4.0 3.5 - 5.0 g/dL   AST 24 15 - 41 U/L   ALT 18 14 - 54 U/L   Alkaline Phosphatase 72 38 - 126 U/L   Total Bilirubin 0.8 0.3 - 1.2 mg/dL   GFR calc non Af Amer >60 >60 mL/min   GFR calc Af Amer >60 >60 mL/min    Comment: (NOTE) The eGFR has been calculated using the CKD EPI equation. This calculation has not been validated in all clinical situations. eGFR's persistently <60 mL/min signify possible Chronic Kidney Disease.    Anion gap 9 5 - 15    Comment: Performed at [address] [place] [id number]     Status: Abnormal   Collection Time: 12/10/17 12:29 PM  Result Value Ref Range   WBC 10.8 (H)  4.0 - 10.5 K/uL   RBC 4.83 3.87 - 5.11 MIL/uL   Hemoglobin 13.6 12.0 - 15.0 g/dL   HCT 42.0 36.0 - 46.0 %   MCV 87.0 78.0 - 100.0 fL   MCH 28.2 26.0 - 34.0 pg   MCHC 32.4 30.0 - 36.0 g/dL   RDW 12.7 11.5 - 15.5 %   Platelets 177 150 - 400 K/uL    Comment: Performed at [facility], [place] [address], [place], [place] [address]  I-Stat beta hCG blood, ED     Status: None   Collection Time: 12/10/17 12:44 PM  Result Value Ref Range   I-stat hCG, quantitative <5.0 <5 mIU/mL   Comment 3            Comment:   GEST. AGE      CONC.  (mIU/mL)   <=1 WEEK        5 - 50     2 WEEKS       50 - 500     3 WEEKS       100 - 10,000     4 WEEKS     1,000 - 30,000        FEMALE AND NON-PREGNANT FEMALE:     LESS THAN 5 mIU/mL   Urinalysis, Routine w reflex microscopic     Status: Abnormal   Collection Time: 12/10/17  1:27 PM  Result  Value Ref Range   Color, Urine YELLOW YELLOW   APPearance CLEAR CLEAR   Specific Gravity, Urine 1.020 1.005 - 1.030   pH 6.0 5.0 - 8.0   Glucose, UA NEGATIVE NEGATIVE mg/dL   Hgb urine dipstick NEGATIVE NEGATIVE   Bilirubin Urine NEGATIVE NEGATIVE   Ketones, ur NEGATIVE NEGATIVE mg/dL   Protein, ur NEGATIVE NEGATIVE mg/dL   Nitrite NEGATIVE NEGATIVE   Leukocytes, UA SMALL (A) NEGATIVE   WBC, UA 6-10 0 - 5 WBC/hpf   Bacteria, UA RARE (A) NONE SEEN   Squamous Epithelial / LPF 0-5 0 - 5   Mucus PRESENT     Comment: Performed at [facility], [place] [address], [place],  [address]  I-Stat CG4 Lactic Acid, ED     Status: None   Collection Time: 12/10/17  5:23 PM  Result Value Ref Range   Lactic Acid, Venous 1.80 0.5 - 1.9 mmol/L    Ct Renal Stone Study  Result Date: 12/10/2017 CLINICAL DATA:  Acute left flank pain. EXAM: CT ABDOMEN AND PELVIS WITHOUT CONTRAST TECHNIQUE: Multidetector CT imaging of the abdomen and pelvis was performed following the standard protocol without IV contrast. COMPARISON:  None. FINDINGS: Lower chest: No acute abnormality. Hepatobiliary: No focal liver abnormality is seen. No gallstones, gallbladder wall thickening, or biliary dilatation. Pancreas: Unremarkable. No pancreatic ductal dilatation or surrounding inflammatory changes. Spleen: Normal in size without focal abnormality. Adrenals/Urinary Tract: Adrenal glands appear normal. Right kidney and ureter appear normal. Moderate left hydroureteronephrosis with perinephric stranding is noted secondary to 3 mm calculus at left ureterovesical junction. Urinary bladder is otherwise unremarkable. Stomach/Bowel: Stomach is within normal limits. Appendix appears normal. No evidence of bowel wall thickening, distention, or inflammatory changes. Vascular/Lymphatic: No significant vascular findings are present. No enlarged abdominal or pelvic lymph nodes. Reproductive: Uterus and bilateral adnexa are unremarkable. Other:  Small fat containing left inguinal hernia is noted. No ascites is noted. Musculoskeletal: No acute or significant osseous findings. IMPRESSION: Moderate left hydronephrosis with perinephric stranding is noted secondary to 3 mm distal left ureterovesical  junction calculus. Electronically Signed   By: Marijo Conception, M.D.   On: 12/10/2017 15:31    Review of Systems  Constitutional: Positive for chills, fever and malaise/fatigue.  HENT: Negative.   Eyes: Negative.   Respiratory: Negative.   Cardiovascular: Negative.   Gastrointestinal: Negative.   Genitourinary: Negative.   Musculoskeletal: Negative.   Skin: Negative.   Neurological: Negative.   Endo/Heme/Allergies: Negative.   Psychiatric/Behavioral: Negative.    Blood pressure 125/71, pulse (!) 122, temperature (!) 104.4 F (40.2 C), temperature source Rectal, resp. rate 16, last menstrual period 01/04/2014, SpO2 92 %. Physical Exam  Constitutional: She appears well-developed.  Some visible malaise. In ER with family at bedside.   HENT:  Head: Normocephalic.  Eyes: Pupils are equal, round, and reactive to light.  Respiratory: Effort normal.  GI: Soft.  Genitourinary:  Genitourinary Comments: Mild left CVAT at present.   Neurological: She is alert.  Skin: Skin is warm.  Psychiatric: She has a normal mood and affect.    Assessment/Plan:   1 - Left Ureteral Stone - rec emergent stenting today for renal decompression, then ureteroscopy in elective setting in few weeks after clears infectious parameters.  Risks, benefits, alternaitves (neph tube), expected peri-op course discussed.   2 - Urosepsis / Obstructing Pyelonephritis - she has evolving sepsis with relative immune compromise. Agree with aggressive IV ABX with low thrshold for ICU transfer if hemodynamic instability develops.   Napolean Sia 12/10/2017, 6:33 PM

## 2017-12-10 NOTE — ED Provider Notes (Signed)
[facility] EMERGENCY DEPARTMENT Provider Note   CSN: [id number] Arrival date & time: 12/10/17  1131     History   Chief Complaint Chief Complaint  Patient presents with  . Abdominal Pain    HPI Victoria Singh is a 52 y.o. female.  HPI Victoria Singh is a 52 y.o. female with history of anemia and rheumatoid arthritis, presents to emergency department complaining of left flank pain.  Patient states pain started this morning.  Reports associated nausea and vomiting.  Pain radiates from left flank into the left lower abdomen.  Reports associated dysuria and urinary frequency.  Normal bowel movement this morning.  No fever or chills.  No history of kidney stones.  States has history of frequent UTIs.  Did not take any medications prior to coming in.  Received fentanyl intranasally and Zofran ODT in triage which did not help.  Past Medical History:  Diagnosis Date  . Anemia   . Personal history of colonic polyp - adenoma 01/30/2014  . Rheumatoid arthritis [facility])     Patient Active Problem List   Diagnosis Date Noted  . Personal history of colonic polyp - adenoma 01/30/2014    Past Surgical History:  Procedure Laterality Date  . DIAGNOSTIC LAPAROSCOPY  1992  . TUBAL LIGATION  2004     OB History   None      Home Medications    Prior to Admission medications   Medication Sig Start Date End Date Taking? Authorizing Provider  Ascorbic Acid (VITAMIN C PO) Take by mouth daily.    [provider]  Calcium Carb-Cholecalciferol (CALCIUM 1000 + D PO) Take by mouth.    [provider]  Cholecalciferol (VITAMIN D PO) Take by mouth daily.    [provider]  CRANBERRY PO Take by mouth daily.    [provider]  Cyanocobalamin (B-12 PO) Take by mouth daily.    [provider]  FOLIC ACID PO Take by mouth daily.    [provider]  IRON PO Take by mouth daily.    [provider]  methotrexate  (RHEUMATREX) 2.5 MG tablet Take 2.5 mg by mouth once a week. Caution:Chemotherapy. Protect from light. Takes 8 tablets once a week    [provider]  Multiple Vitamin (MULTIVITAMIN) tablet Take 1 tablet by mouth daily.    [provider]  Omega-3 Fatty Acids (FISH OIL PO) Take by mouth daily.    [provider]  Probiotic Product (PROBIOTIC PO) Take by mouth daily.    [provider]    Family History Family History  Problem Relation Age of Onset  . Colon cancer Neg Hx     Social History Social History   Tobacco Use  . Smoking status: Never Smoker  . Smokeless tobacco: Never Used  Substance Use Topics  . Alcohol use: No  . Drug use: No     Allergies   Sulfa antibiotics   Review of Systems Review of Systems  Constitutional: Negative for chills and fever.  Respiratory: Negative for cough, chest tightness and shortness of breath.   Cardiovascular: Negative for chest pain, palpitations and leg swelling.  Gastrointestinal: Positive for abdominal pain, nausea and vomiting. Negative for constipation and diarrhea.  Genitourinary: Positive for dysuria, flank pain, frequency and urgency. Negative for pelvic pain, vaginal bleeding, vaginal discharge and vaginal pain.  Musculoskeletal: Negative for arthralgias, myalgias, neck pain and neck stiffness.  Skin: Negative for rash.  Neurological: Negative for dizziness,  weakness and headaches.  All other systems reviewed and are negative.    Physical Exam Updated Vital Signs BP 116/71 (BP Location: Right Arm)   Pulse 99   Temp 97.9 F (36.6 C) (Oral)   Resp 16   LMP 01/04/2014   SpO2 99%   Physical Exam  Constitutional: She appears well-developed and well-nourished. No distress.  HENT:  Head: Normocephalic.  Eyes: Conjunctivae are normal.  Neck: Neck supple.  Cardiovascular: Normal rate, regular rhythm and normal heart sounds.  Pulmonary/Chest: Effort normal and breath sounds normal. No  respiratory distress. She has no wheezes. She has no rales.  Abdominal: Soft. Bowel sounds are normal. She exhibits no distension. There is no tenderness. There is no rebound.  Left cva tenderness  Musculoskeletal: She exhibits no edema.  Neurological: She is alert.  Skin: Skin is warm and dry.  Psychiatric: She has a normal mood and affect. Her behavior is normal.  Nursing note and vitals reviewed.    ED Treatments / Results  Labs (all labs ordered are listed, but only abnormal results are displayed) Labs Reviewed  COMPREHENSIVE METABOLIC PANEL - Abnormal; Notable for the following components:      Result Value   Glucose, Bld 122 (*)    All other components within normal limits  CBC - Abnormal; Notable for the following components:   WBC 10.8 (*)    All other components within normal limits  URINALYSIS, ROUTINE W REFLEX MICROSCOPIC - Abnormal; Notable for the following components:   Leukocytes, UA SMALL (*)    Bacteria, UA RARE (*)    All other components within normal limits  LIPASE, BLOOD  I-STAT BETA HCG BLOOD, ED ([facility], [facility], [facility] ONLY)  I-STAT CG4 LACTIC ACID, ED    EKG None  Radiology No results found.  Procedures Procedures (including critical care time)  Medications Ordered in ED Medications  fentaNYL (SUBLIMAZE) injection 50 mcg (50 mcg Nasal Given 12/10/17 1230)  morphine 4 MG/ML injection 4 mg (has no administration in time range)  ondansetron (ZOFRAN) injection 4 mg (has no administration in time range)  sodium chloride 0.9 % bolus 1,000 mL (has no administration in time range)  ondansetron (ZOFRAN-ODT) disintegrating tablet 4 mg (4 mg Oral Given 12/10/17 1231)     Initial Impression / Assessment and Plan / ED Course  I have reviewed the triage vital signs and the nursing notes.  Pertinent labs & imaging results that were available during my care of the patient were reviewed by me and considered in my medical decision making (see chart for details).      Patient in emergency department with left flank pain radiating into the left abdomen.  Associated nausea and vomiting.  Appears to be very uncomfortable.  Concerning for pyelonephritis versus kidney stone.  There is no significant abdominal tenderness.  Will get labs, urine analysis, get CT renal for further evaluation. Pain medications and IV fluids ordered.   3:46 PM CT confirming left hydronephrosis with a 3 mm stone at Left UVJ.  Patient states she is still having some pain.  I will try Toradol and another dose of morphine.  Will reassess after.  We will do oral trial as well.  4:23 PM Pt signed out at shift change pending reassessment after pain medications.   Vitals:   12/10/17 1221 12/10/17 1515  BP: 116/71 (!) 145/71  Pulse: 99 (!) 114  Resp: 16   Temp: 97.9 F (36.6 C)   TempSrc: Oral   SpO2: 99% 99%  Final Clinical Impressions(s) / ED Diagnoses   Final diagnoses:  Nephrolithiasis    ED Discharge Orders    None       Jeannett Senior, PA-C 12/10/17 1640    Orlie Dakin, MD 12/10/17 [phone]

## 2017-12-10 NOTE — Anesthesia Preprocedure Evaluation (Addendum)
Anesthesia Evaluation  Patient identified by MRN, date of birth, ID band Patient awake    Reviewed: Allergy & Precautions, NPO status , Patient's Chart, lab work & pertinent test results  Airway Mallampati: II  TM Distance: >3 FB Neck ROM: Full    Dental  (+) Dental Advisory Given   Pulmonary neg pulmonary ROS,    breath sounds clear to auscultation       Cardiovascular negative cardio ROS   Rhythm:Regular Rate:Normal     Neuro/Psych negative neurological ROS  negative psych ROS   GI/Hepatic negative GI ROS, Neg liver ROS,   Endo/Other  negative endocrine ROS  Renal/GU Left ureteral stone with possible pyelonephritis  negative genitourinary   Musculoskeletal  (+) Arthritis , Rheumatoid disorders,    Abdominal   Peds  Hematology negative hematology ROS (+)   Anesthesia Other Findings   Reproductive/Obstetrics                            Anesthesia Physical Anesthesia Plan  ASA: II and emergent  Anesthesia Plan: General   Post-op Pain Management:    Induction: Intravenous and Rapid sequence  PONV Risk Score and Plan: 4 or greater and Treatment may vary due to age or medical condition, Ondansetron, Midazolam, Scopolamine patch - Pre-op and Dexamethasone  Airway Management Planned: Oral ETT  Additional Equipment: None  Intra-op Plan:   Post-operative Plan: Extubation in OR  Informed Consent: I have reviewed the patients History and Physical, chart, labs and discussed the procedure including the risks, benefits and alternatives for the proposed anesthesia with the patient or authorized representative who has indicated his/her understanding and acceptance.   Dental advisory given  Plan Discussed with: CRNA and Anesthesiologist  Anesthesia Plan Comments:        Anesthesia Quick Evaluation

## 2017-12-10 NOTE — Brief Op Note (Signed)
12/10/2017  9:01 PM  PATIENT:  Victoria Singh  53 y.o. female  PRE-OPERATIVE DIAGNOSIS:  left ureteral stone, urosepsis  POST-OPERATIVE DIAGNOSIS:  same  PROCEDURE:  Procedure(s): CYSTOSCOPY WITH RETROGRADE PYELOGRAM/URETERAL STENT PLACEMENT (Left)  SURGEON:  Surgeon(s) and Role:    * Alexis Frock, MD - Primary  PHYSICIAN ASSISTANT:   ASSISTANTS: none   ANESTHESIA:   general  EBL:  minimal   BLOOD ADMINISTERED:none  DRAINS: 8F temp probe foley to gravity   LOCAL MEDICATIONS USED:  NONE  SPECIMEN:  No Specimen  DISPOSITION OF SPECIMEN:  N/A  COUNTS:  YES  TOURNIQUET:  * No tourniquets in log *  DICTATION: .Other Dictation: Dictation Number [id number]  PLAN OF CARE: Admit to inpatient   PATIENT DISPOSITION:  PACU - hemodynamically stable.   Delay start of Pharmacological VTE agent (>24hrs) due to surgical blood loss or risk of bleeding: not applicable

## 2017-12-10 NOTE — Transfer of Care (Signed)
Immediate Anesthesia Transfer of Care Note  Patient: Victoria Singh  Procedure(s) Performed: CYSTOSCOPY WITH RETROGRADE PYELOGRAM/URETERAL STENT PLACEMENT (Left )  Patient Location: PACU  Anesthesia Type:General  Level of Consciousness: awake, alert  and oriented  Airway & Oxygen Therapy: Patient Spontanous Breathing and Patient connected to nasal cannula oxygen  Post-op Assessment: Report given to RN, Post -op Vital signs reviewed and stable and Patient moving all extremities X 4  Post vital signs: Reviewed and stable  Last Vitals:  Vitals Value Taken Time  BP 93/62 12/10/2017  9:19 PM  Temp 36.5 C 12/10/2017  9:14 PM  Pulse 145 12/10/2017  9:20 PM  Resp 23 12/10/2017  9:20 PM  SpO2 97 % 12/10/2017  9:20 PM  Vitals shown include unvalidated device data.  Last Pain:  Vitals:   12/10/17 1933  TempSrc: Oral  PainSc:       Patients Stated Pain Goal: 0 (74/14/23 9532)  Complications: No apparent anesthesia complications

## 2017-12-10 NOTE — Anesthesia Postprocedure Evaluation (Signed)
Anesthesia Post Note  Patient: Victoria Singh  Procedure(s) Performed: CYSTOSCOPY WITH RETROGRADE PYELOGRAM/URETERAL STENT PLACEMENT (Left )     Patient location during evaluation: PACU Anesthesia Type: General Level of consciousness: awake and alert Pain management: pain level controlled Vital Signs Assessment: post-procedure vital signs reviewed and stable Respiratory status: spontaneous breathing, nonlabored ventilation and respiratory function stable Cardiovascular status: stable and tachycardic Postop Assessment: no apparent nausea or vomiting Anesthetic complications: no    Last Vitals:  Vitals:   12/10/17 2115 12/10/17 2130  BP:  98/61  Pulse: (!) 144 (!) 132  Resp: (!) 21 (!) 21  Temp:    SpO2: 97% 100%    Last Pain:  Vitals:   12/10/17 2130  TempSrc:   PainSc: 0-No pain                 Audry Pili

## 2017-12-10 NOTE — ED Triage Notes (Signed)
Pt presents for evaluation of abd pain to LLQ and flank. Pt reports emesis starting after abd pain. Pt reports normal BM and denies diarrhea. Pt denies fever.

## 2017-12-10 NOTE — Anesthesia Procedure Notes (Signed)
Procedure Name: Intubation Date/Time: 12/10/2017 8:36 PM Performed by: Suzy Bouchard, CRNA Pre-anesthesia Checklist: Patient identified, Emergency Drugs available, Suction available, Patient being monitored and Timeout performed Patient Re-evaluated:Patient Re-evaluated prior to induction Oxygen Delivery Method: Circle system utilized Preoxygenation: Pre-oxygenation with 100% oxygen Induction Type: Rapid sequence and IV induction Ventilation: Mask ventilation without difficulty Laryngoscope Size: Miller and 2 Grade View: Grade I Tube type: Oral Tube size: 7.0 mm Number of attempts: 1 Airway Equipment and Method: Stylet Placement Confirmation: positive ETCO2,  ETT inserted through vocal cords under direct vision and breath sounds checked- equal and bilateral Secured at: 21 cm Tube secured with: Tape Dental Injury: Teeth and Oropharynx as per pre-operative assessment

## 2017-12-11 ENCOUNTER — Inpatient Hospital Stay (HOSPITAL_COMMUNITY): Payer: BC Managed Care – PPO

## 2017-12-11 ENCOUNTER — Encounter (HOSPITAL_COMMUNITY): Payer: Self-pay | Admitting: Urology

## 2017-12-11 ENCOUNTER — Other Ambulatory Visit: Payer: Self-pay

## 2017-12-11 LAB — CBC
HCT: 36.1 % (ref 36.0–46.0)
Hemoglobin: 11.9 g/dL — ABNORMAL LOW (ref 12.0–15.0)
MCH: 29 pg (ref 26.0–34.0)
MCHC: 33 g/dL (ref 30.0–36.0)
MCV: 87.8 fL (ref 78.0–100.0)
PLATELETS: 113 10*3/uL — AB (ref 150–400)
RBC: 4.11 MIL/uL (ref 3.87–5.11)
RDW: 13.2 % (ref 11.5–15.5)
WBC: 16.3 10*3/uL — ABNORMAL HIGH (ref 4.0–10.5)

## 2017-12-11 LAB — BLOOD CULTURE ID PANEL (REFLEXED)
ACINETOBACTER BAUMANNII: NOT DETECTED
CANDIDA ALBICANS: NOT DETECTED
CANDIDA KRUSEI: NOT DETECTED
CANDIDA PARAPSILOSIS: NOT DETECTED
Candida glabrata: NOT DETECTED
Candida tropicalis: NOT DETECTED
Carbapenem resistance: NOT DETECTED
ENTEROBACTER CLOACAE COMPLEX: NOT DETECTED
ESCHERICHIA COLI: DETECTED — AB
Enterobacteriaceae species: DETECTED — AB
Enterococcus species: NOT DETECTED
Haemophilus influenzae: NOT DETECTED
KLEBSIELLA OXYTOCA: NOT DETECTED
KLEBSIELLA PNEUMONIAE: NOT DETECTED
Listeria monocytogenes: NOT DETECTED
NEISSERIA MENINGITIDIS: NOT DETECTED
Proteus species: NOT DETECTED
Pseudomonas aeruginosa: NOT DETECTED
SERRATIA MARCESCENS: NOT DETECTED
STAPHYLOCOCCUS SPECIES: NOT DETECTED
STREPTOCOCCUS AGALACTIAE: NOT DETECTED
Staphylococcus aureus (BCID): NOT DETECTED
Streptococcus pneumoniae: NOT DETECTED
Streptococcus pyogenes: NOT DETECTED
Streptococcus species: NOT DETECTED

## 2017-12-11 LAB — BASIC METABOLIC PANEL
Anion gap: 8 (ref 5–15)
BUN: 11 mg/dL (ref 6–20)
CALCIUM: 7.6 mg/dL — AB (ref 8.9–10.3)
CHLORIDE: 109 mmol/L (ref 101–111)
CO2: 22 mmol/L (ref 22–32)
Creatinine, Ser: 0.82 mg/dL (ref 0.44–1.00)
GFR calc non Af Amer: >60 mL/min (ref >60)
Glucose, Bld: 153 mg/dL — ABNORMAL HIGH (ref 65–99)
Potassium: 3.1 mmol/L — ABNORMAL LOW (ref 3.5–5.1)
Sodium: 139 mmol/L (ref 135–145)

## 2017-12-11 LAB — MRSA PCR SCREENING: MRSA by PCR: NEGATIVE

## 2017-12-11 MED ORDER — SODIUM CHLORIDE 0.9 % IV SOLN
2.0000 g | INTRAVENOUS | Status: DC
  Administered 2017-12-11 – 2017-12-12 (×2): 2 g via INTRAVENOUS
  Filled 2017-12-11 (×2): qty 20

## 2017-12-11 MED ORDER — LEFLUNOMIDE 20 MG PO TABS: 10.0000 mg | ORAL_TABLET | Freq: Every day | ORAL | Status: DC

## 2017-12-11 MED ORDER — POTASSIUM CHLORIDE CRYS ER 20 MEQ PO TBCR
40.0000 meq | EXTENDED_RELEASE_TABLET | Freq: Once | ORAL | Status: AC
  Administered 2017-12-11: 40 meq via ORAL
  Filled 2017-12-11: qty 2

## 2017-12-11 NOTE — Progress Notes (Addendum)
Patient with some discomfort in her chest. It is around her sternum and she describes it as a "tightness" when she takes a full deep breath. She doesn't have shortness of breath or any other numbness, pain, or tingling. MD made aware. Chest x-ray was ordered this am and incentive spirometer given to patient with education. Tory Septer, Rande Brunt, RN   EKG ordered and completed at bedside.

## 2017-12-11 NOTE — Progress Notes (Signed)
1 Day Post-Op   Subjective/Chief Complaint:   1 - Left Ureteral Stone - s/p emergent cysto stent placement (6x26 contour) 5/26 for 72mm left distal stone with mod hydro by ER CT 11/2017 on eval flank pain. Stone is solitary, 6mm at UVJ.  2 - Urosepsis / Obstructing Pyelonephritis / E. Coli Bacteremia- new fevers to 104 with bacteruria during ER eval for left ureeral stone. Sig perinephric stranding but no gas in renal parnechyma or peri-nephric abscess. 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 UCX pending, placed on empiric rocephin. [place] e. Coli / pending.   Today "Victoria Singh" is stable but remains critically ill. Fever curve trending down. UOP acceptable. Pressors just weaned this AM.    Objective: Vital signs in last 24 hours: Temp:  [97.7 F (36.5 C)-104.4 F (40.2 C)] 99.3 F (37.4 C) (05/27 0800) Pulse Rate:  [73-144] 73 (05/27 0800) Resp:  [13-24] 15 (05/27 0800) BP: (81-147)/(51-82) 108/75 (05/27 0800) SpO2:  [91 %-100 %] 100 % (05/27 0800) Weight:  [82 kg (180 lb 12.4 oz)] 82 kg (180 lb 12.4 oz) (05/26 2230) Last BM Date: (PTA)  Intake/Output from previous day: 05/26 0701 - 05/27 0700 In: 1935.3 [I.V.:1935.3] Out: 551 [Urine:550; Blood:1] Intake/Output this shift: Total I/O In: 235.1 [I.V.:235.1] Out: 345 [Urine:345]  General appearance: alert, cooperative, appears stated age and less visible malise.  Head: Normocephalic, without obvious abnormality, atraumatic Nose: Nares normal. Septum midline. Mucosa normal. No drainage or sinus tenderness. Throat: lips, mucosa, and tongue normal; teeth and gums normal Neck: supple, symmetrical, trachea midline Back: symmetric, no curvature. ROM normal. No CVA tenderness. Resp: non-labored on minimal [place] O2.  Cardio: regular tachycardia about 110bpm GI: soft, non-tender; bowel sounds normal; no masses,  no organomegaly Pelvic: external genitalia normal and foley in place with medium yellow urine that is  non-foul.  Extremities: extremities normal, atraumatic, no cyanosis or edema Pulses: 2+ and symmetric Skin: Skin color, texture, turgor normal. No rashes or lesions Lymph nodes: Cervical, supraclavicular, and axillary nodes normal. Neurologic: Grossly normal  Lab Results:  Recent Labs    12/10/17 1229 12/11/17 0256  WBC 10.8* 16.3*  HGB 13.6 11.9*  HCT 42.0 36.1  PLT 177 113*   BMET Recent Labs    12/10/17 1229 12/11/17 0256  NA 137 139  K 3.7 3.1*  CL 104 109  CO2 24 22  GLUCOSE 122* 153*  BUN 8 11  CREATININE 0.69 0.82  CALCIUM 9.0 7.6*   PT/INR No results for input(s): LABPROT, INR in the last 72 hours. ABG No results for input(s): PHART, HCO3 in the last 72 hours.  Invalid input(s): PCO2, PO2  Studies/Results: Ct Renal Stone Study  Result Date: 12/10/2017 CLINICAL DATA:  Acute left flank pain. EXAM: CT ABDOMEN AND PELVIS WITHOUT CONTRAST TECHNIQUE: Multidetector CT imaging of the abdomen and pelvis was performed following the standard protocol without IV contrast. COMPARISON:  None. FINDINGS: Lower chest: No acute abnormality. Hepatobiliary: No focal liver abnormality is seen. No gallstones, gallbladder wall thickening, or biliary dilatation. Pancreas: Unremarkable. No pancreatic ductal dilatation or surrounding inflammatory changes. Spleen: Normal in size without focal abnormality. Adrenals/Urinary Tract: Adrenal glands appear normal. Right kidney and ureter appear normal. Moderate left hydroureteronephrosis with perinephric stranding is noted secondary to 3 mm calculus at left ureterovesical junction. Urinary bladder is otherwise unremarkable. Stomach/Bowel: Stomach is within normal limits. Appendix appears normal. No evidence of bowel wall thickening, distention, or inflammatory changes. Vascular/Lymphatic: No significant vascular findings are present. No enlarged abdominal  or pelvic lymph nodes. Reproductive: Uterus and bilateral adnexa are unremarkable. Other: Small  fat containing left inguinal hernia is noted. No ascites is noted. Musculoskeletal: No acute or significant osseous findings. IMPRESSION: Moderate left hydronephrosis with perinephric stranding is noted secondary to 3 mm distal left ureterovesical junction calculus. Electronically Signed   By: Marijo Conception, M.D.   On: 12/10/2017 15:31    Anti-infectives: Anti-infectives (From admission, onward)   Start     Dose/Rate Route Frequency Ordered Stop   12/10/17 1800  cefTRIAXone (ROCEPHIN) 1 g in sodium chloride 0.9 % 100 mL IVPB     1 g 200 mL/hr over 30 Minutes Intravenous  Once 12/10/17 1745 12/10/17 2237      Assessment/Plan:  1 - Left Ureteral Stone - now temporized with stent for renal decompression. We will arrange for definitive management with ureteroscopy in few weeks in outpatient setting.   2 - Urosepsis / Obstructing Pyelonephritis - continue ABX, IVF, close clinical monitoring. I would rec afebrile x 24 hours and final C/S results before DC home with total course 14 days ABX therapy.Continue foley for now as still in midst of SIRS reaction. I feel best to remain ICU today as she has been on/off pressors overnight.   Greatly appreciate fam med service co management. Please call me directly with questions anytime.   [facility], Victoria Singh 12/11/2017

## 2017-12-11 NOTE — Progress Notes (Signed)
PHARMACY - PHYSICIAN COMMUNICATION CRITICAL VALUE ALERT - BLOOD CULTURE IDENTIFICATION (BCID)  Results for orders placed or performed during the hospital encounter of 12/10/17  Blood Culture ID Panel (Reflexed) (Collected: 12/10/2017  6:45 PM)  Result Value Ref Range   Enterococcus species NOT DETECTED NOT DETECTED   Listeria monocytogenes NOT DETECTED NOT DETECTED   Staphylococcus species NOT DETECTED NOT DETECTED   Staphylococcus aureus NOT DETECTED NOT DETECTED   Streptococcus species NOT DETECTED NOT DETECTED   Streptococcus agalactiae NOT DETECTED NOT DETECTED   Streptococcus pneumoniae NOT DETECTED NOT DETECTED   Streptococcus pyogenes NOT DETECTED NOT DETECTED   Acinetobacter baumannii NOT DETECTED NOT DETECTED   Enterobacteriaceae species DETECTED (A) NOT DETECTED   Enterobacter cloacae complex NOT DETECTED NOT DETECTED   Escherichia coli DETECTED (A) NOT DETECTED   Klebsiella oxytoca NOT DETECTED NOT DETECTED   Klebsiella pneumoniae NOT DETECTED NOT DETECTED   Proteus species NOT DETECTED NOT DETECTED   Serratia marcescens NOT DETECTED NOT DETECTED   Carbapenem resistance NOT DETECTED NOT DETECTED   Haemophilus influenzae NOT DETECTED NOT DETECTED   Neisseria meningitidis NOT DETECTED NOT DETECTED   Pseudomonas aeruginosa NOT DETECTED NOT DETECTED   Candida albicans NOT DETECTED NOT DETECTED   Candida glabrata NOT DETECTED NOT DETECTED   Candida krusei NOT DETECTED NOT DETECTED   Candida parapsilosis NOT DETECTED NOT DETECTED   Candida tropicalis NOT DETECTED NOT DETECTED    Name of physician (or Provider) Contacted: [place]Mullen with FP  Changes to prescribed antibiotics required: Add Rocephin 2g IV q 24h   Nubia Ziesmer S. Alford Highland, PharmD, BCPS Clinical Staff Pharmacist [facility] 12/11/2017  8:33 AM

## 2017-12-11 NOTE — Progress Notes (Addendum)
Family Medicine Teaching Service Daily Progress Note Intern Pager: [phone]  Patient name: Victoria Singh Medical record number: [id number] Date of birth: 08-23-1965 Age: 52 y.o. Gender: female  Primary Care Provider: Maisie Fus, MD Consultants: urology Code Status: full  Pt Overview and Major Events to Date:  5/26- admitted, stent placed in left ureter  Assessment and Plan: TOWANDA HORNSTEIN is a 52 y.o. female presenting with left flank pain . PMH is significant for rheumatoid arthritis on methotrexate, anemia.  Urosepsis- secondary to L ureteral stone. S/p stenting by urology evening 5/26. Blood cultures with ecoli in 2/2 bottles -urology following, appreciate recommendations -increase CTX to 2g q24 hours -urine cx pending -blood cx w/ ecoli in 2/2 bottles -monitor for fevers, worsening illness -increase IVF to 150/hr -tylenol as needed for fever, pain -morphine 4 mg q4 hours as needed  Hypotension- post operatively had low blood pressures requiring levophed overnight, this was turned off this am at 8am -monitor BP closely -if stable, transition to step down  Rheumatoid arthritis- on methotrexate weekly along with folic acid, U20 and leflunomide daily -continue home meds -monitor CBC  Hx of Anemia- secondary to MTX use as above, hemoglobin normal at 13.6 -monitor CBC  FEN/GI: NPO for procedure then regular diet Prophylaxis: lovenox  Disposition: pending improvement, likely transfer to step down this AM  Subjective:  Feels better, has some lower abdominal pain. She has central chest pain with deep breaths. No other complaints.  Objective: Temp:  [97.7 F (36.5 C)-104.4 F (40.2 C)] 99 F (37.2 C) (05/27 0700) Pulse Rate:  [79-144] 79 (05/27 0700) Resp:  [13-24] 15 (05/27 0700) BP: (81-147)/(51-82) 101/73 (05/27 0700) SpO2:  [91 %-100 %] 100 % (05/27 0700) Weight:  [180 lb 12.4 oz (82 kg)] 180 lb 12.4 oz (82 kg) (05/26 2230) Physical Exam: General: well  appearing, laying in bed in NAD Cardiovascular: RRR no mRG. No chest wall tenderness Respiratory: CTAB. Normal work of breathing.  Abdomen: soft, NDNT Extremities: warm, well perfused, no edema or cyanosis  Laboratory: Recent Labs  Lab 12/10/17 1229 12/11/17 0256  WBC 10.8* 16.3*  HGB 13.6 11.9*  HCT 42.0 36.1  PLT 177 113*   Recent Labs  Lab 12/10/17 1229 12/11/17 0256  NA 137 139  K 3.7 3.1*  CL 104 109  CO2 24 22  BUN 8 11  CREATININE 0.69 0.82  CALCIUM 9.0 7.6*  PROT 7.0  --   BILITOT 0.8  --   ALKPHOS 72  --   ALT 18  --   AST 24  --   GLUCOSE 122* 153*   Imaging/Diagnostic Tests:   Ct Renal Stone Study  Result Date: 12/10/2017 CLINICAL DATA:  Acute left flank pain. EXAM: CT ABDOMEN AND PELVIS WITHOUT CONTRAST TECHNIQUE: Multidetector CT imaging of the abdomen and pelvis was performed following the standard protocol without IV contrast. COMPARISON:  None. FINDINGS: Lower chest: No acute abnormality. Hepatobiliary: No focal liver abnormality is seen. No gallstones, gallbladder wall thickening, or biliary dilatation. Pancreas: Unremarkable. No pancreatic ductal dilatation or surrounding inflammatory changes. Spleen: Normal in size without focal abnormality. Adrenals/Urinary Tract: Adrenal glands appear normal. Right kidney and ureter appear normal. Moderate left hydroureteronephrosis with perinephric stranding is noted secondary to 3 mm calculus at left ureterovesical junction. Urinary bladder is otherwise unremarkable. Stomach/Bowel: Stomach is within normal limits. Appendix appears normal. No evidence of bowel wall thickening, distention, or inflammatory changes. Vascular/Lymphatic: No significant vascular findings are present. No enlarged abdominal or pelvic lymph  nodes. Reproductive: Uterus and bilateral adnexa are unremarkable. Other: Small fat containing left inguinal hernia is noted. No ascites is noted. Musculoskeletal: No acute or significant osseous findings.  IMPRESSION: Moderate left hydronephrosis with perinephric stranding is noted secondary to 3 mm distal left ureterovesical junction calculus. Electronically Signed   By: Marijo Conception, M.D.   On: 12/10/2017 15:31    Steve Rattler, DO 12/11/2017, 7:38 AM PGY-2, [place] Intern pager: [phone], text pages welcome

## 2017-12-11 NOTE — Op Note (Signed)
NAME: MARJON, DOXTATER MEDICAL RECORD [id number] ACCOUNT [id number] DATE OF BIRTH:10-04-1965 FACILITY: [facility] LOCATION: [id number] PHYSICIAN:Liliahna Cudd, MD  OPERATIVE REPORT  DATE OF PROCEDURE:  12/10/2017  PREOPERATIVE DIAGNOSIS:  Left ureteral stone with urosepsis.  PROCEDURE:   1.  Cystoscopy, left retrograde pyelogram interpretation 2.  Left ureteral stent placement, 6 x 26 Contour, no tether.  ESTIMATED BLOOD LOSS:  Nil.  COMPLICATIONS:  None.  SPECIMENS:  None.  FINDINGS: 1.  Moderate left hydroureteronephrosis with some mild tortuosity to a filling defect in the distal ureter because of known stone. 2.  Successful placement of left ureteral stent proximal and renal pelvis, distal end in urinary bladder.  INDICATIONS:  The patient is a pleasant 52 year old lady with a history of rheumatoid arthritis on immune modulators and immunosuppressives.  She was found on workup for colicky left flank pain to have a left distal ureteral stone.  Her pain was  initially moderately controlled; however, she acutely developed fevers and tachycardia in the emergency room with fevers up to 104 and tachycardia up to 122.  Her UA did have some bacteriuria.  The overall picture was concerning for impending obstructed  urosepsis from her stone.  It was clearly felt that emergent renal decompression was warranted.  Options including a nephrostomy tube versus stenting were discussed.  She wished to proceed with a left ureteral stenting.  Informed consent was obtained and  placed in the medical record.    DESCRIPTION OF PROCEDURE:  The patient was identified as being herself, procedure being left ureteral stent placement was confirmed.  Procedure performed after antibiotics administered.  General endotracheal anesthesia induced.    DESCRIPTION OF PROCEDURE:  The patient was placed into a low lithotomy position and sterile field was created by prepping and draping the patient's vaginal introitus and  proximal thighs using IODINE.  A cystourethroscopy was performed using a 22-French  rigid cystoscope with the offset lens.  Inspection of her bladder revealed no diverticula, calcifications, lesions.  The left ureteral orifice was cannulated with a 6-French Foley catheter and left retrograde pyelogram was obtained.  Very gentle left retrograde pyelogram demonstrated a single left ureter with a single system left kidney.  There was moderate hydronephrosis and mild tortuosity to a mobile filling the defect in the distal ureter consistent with known stone.  A 0.038  ZIPwire was advanced in the lower pole over which a new 6 x 26 contour type stent was placed.  Good proximal curl were noted.  There was immediate efflux of proteinaceous appearing urine around the distal end of the stent.  She did develop some  intraoperative mild hypotension and a temperature probe.  14-French Foley catheter was placed per urethra to straight drain 10 mL in the balloon.  The procedure terminated.  The patient tolerated procedure well with no immediate complications.  The  patient was taken to [place] [facility] in stable condition.    It is unclear at this point whether she will be stable for monitored floor bed versus ICU pending her emergence from anesthesia.  AN/NUANCE  D:12/10/2017 T:12/11/2017 JOB:[id number]

## 2017-12-12 ENCOUNTER — Other Ambulatory Visit: Payer: Self-pay | Admitting: Urology

## 2017-12-12 LAB — CBC
HEMATOCRIT: 33.5 % — AB (ref 36.0–46.0)
Hemoglobin: 11.1 g/dL — ABNORMAL LOW (ref 12.0–15.0)
MCH: 28.6 pg (ref 26.0–34.0)
MCHC: 33.1 g/dL (ref 30.0–36.0)
MCV: 86.3 fL (ref 78.0–100.0)
Platelets: 73 10*3/uL — ABNORMAL LOW (ref 150–400)
RBC: 3.88 MIL/uL (ref 3.87–5.11)
RDW: 13.6 % (ref 11.5–15.5)
WBC: 14.4 10*3/uL — ABNORMAL HIGH (ref 4.0–10.5)

## 2017-12-12 LAB — URINE CULTURE

## 2017-12-12 LAB — BASIC METABOLIC PANEL
Anion gap: 7 (ref 5–15)
BUN: 12 mg/dL (ref 6–20)
CO2: 23 mmol/L (ref 22–32)
CREATININE: 0.62 mg/dL (ref 0.44–1.00)
Calcium: 8.1 mg/dL — ABNORMAL LOW (ref 8.9–10.3)
Chloride: 111 mmol/L (ref 101–111)
GFR calc non Af Amer: >60 mL/min (ref >60)
Glucose, Bld: 116 mg/dL — ABNORMAL HIGH (ref 65–99)
Potassium: 3.7 mmol/L (ref 3.5–5.1)
Sodium: 141 mmol/L (ref 135–145)

## 2017-12-12 LAB — HIV ANTIBODY (ROUTINE TESTING W REFLEX): HIV Screen 4th Generation wRfx: NONREACTIVE

## 2017-12-12 MED ORDER — HYDROCODONE-ACETAMINOPHEN 5-325 MG PO TABS
1.0000 | ORAL_TABLET | Freq: Four times a day (QID) | ORAL | Status: DC | PRN
  Administered 2017-12-12 – 2017-12-14 (×4): 1 via ORAL
  Filled 2017-12-12 (×4): qty 1

## 2017-12-12 MED ORDER — ALBUTEROL SULFATE (2.5 MG/3ML) 0.083% IN NEBU
2.5000 mg | INHALATION_SOLUTION | RESPIRATORY_TRACT | Status: DC | PRN
Start: 2017-12-12 — End: 2017-12-14
  Administered 2017-12-12 – 2017-12-13 (×2): 2.5 mg via RESPIRATORY_TRACT
  Filled 2017-12-12 (×2): qty 3

## 2017-12-12 MED ORDER — CEPHALEXIN 250 MG PO CAPS
250.0000 mg | ORAL_CAPSULE | Freq: Four times a day (QID) | ORAL | Status: DC
  Administered 2017-12-12 – 2017-12-13 (×5): 250 mg via ORAL
  Filled 2017-12-12 (×7): qty 1

## 2017-12-12 NOTE — Progress Notes (Signed)
2 Days Post-Op   Subjective/Chief Complaint:  1 - Left Ureteral Stone - s/p emergent cysto stent placement (6x26 contour) 5/26 for 53mm left distal stone with mod hydro by ER CT 11/2017 on eval flank pain. Stone is solitary, 39mm at UVJ.  2 - Urosepsis / Obstructing Pyelonephritis / E. Coli Bacteremia- new fevers to 104 with bacteruria during ER eval for left ureeral stone. Sig perinephric stranding but no gas in renal parnechyma or peri-nephric abscess. 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 UCX pending, placed on empiric rocephin. [place] e. Coli / pending.   Today "Victoria Singh" is improving. Fever curve trending down, now low grade. CX still pending. Has been off pressors >24 hrs.   Objective: Vital signs in last 24 hours: Temp:  [97.9 F (36.6 C)-99.9 F (37.7 C)] 97.9 F (36.6 C) (05/28 0800) Pulse Rate:  [73-119] 73 (05/28 0800) Resp:  [13-28] 15 (05/28 0800) BP: (99-142)/(56-99) 128/87 (05/28 0800) SpO2:  [91 %-100 %] (P) 92 % (05/28 0800) Last BM Date: 12/11/17  Intake/Output from previous day: 05/27 0701 - 05/28 0700 In: 4443.1 [P.O.:720; I.V.:3623.1; IV Piggyback:100] Out: 2720 [Urine:2720] Intake/Output this shift: No intake/output data recorded.   General appearance: alert, cooperative, appears stated age and less visible malise.  Head: Normocephalic, without obvious abnormality, atraumatic Nose: Nares normal. Septum midline. Mucosa normal. No drainage or sinus tenderness. Throat: lips, mucosa, and tongue normal; teeth and gums normal Neck: supple, symmetrical, trachea midline Back: symmetric, no curvature. ROM normal. No CVA tenderness. Resp: non-labored on minimal [place] O2.  Cardio: regular tachycardia about 110bpm GI: soft, non-tender; bowel sounds normal; no masses,  no organomegaly Pelvic: external genitalia normal and foley in place with medium yellow urine that is non-foul.  Extremities: extremities normal, atraumatic, no cyanosis or  edema Pulses: 2+ and symmetric Skin: Skin color, texture, turgor normal. No rashes or lesions Lymph nodes: Cervical, supraclavicular, and axillary nodes normal. Neurologic: Grossly normal  Lab Results:  Recent Labs    12/11/17 0256 12/12/17 0256  WBC 16.3* 14.4*  HGB 11.9* 11.1*  HCT 36.1 33.5*  PLT 113* 73*   BMET Recent Labs    12/11/17 0256 12/12/17 0256  NA 139 141  K 3.1* 3.7  CL 109 111  CO2 22 23  GLUCOSE 153* 116*  BUN 11 12  CREATININE 0.82 0.62  CALCIUM 7.6* 8.1*   PT/INR No results for input(s): LABPROT, INR in the last 72 hours. ABG No results for input(s): PHART, HCO3 in the last 72 hours.  Invalid input(s): PCO2, PO2  Studies/Results: Dg Cystogram  Result Date: 12/11/2017 CLINICAL DATA:  Left ureteral calculus EXAM: RETROGRADE PYELOGRAM FROM OR FLUOROSCOPY TIME:  Fluoroscopy Time: 0 minutes 43 seconds Number of Acquired Spot Images: 5 COMPARISON:  CT abdomen and pelvis Dec 10, 2017 FINDINGS: The left ureter and collecting system are visualized in a retrograde manner. There is a 4 mm filling defect in the distal left ureter. No similar filling defects are noted elsewhere. There is mild fullness of the left renal collecting system. A double-J stent was subsequently placed from the left renal pelvis to the bladder. Calculus no longer seen post stent placement. IMPRESSION: 4 mm distal left ureteral calculus with bold a stent placement on the left. Calculus not seen after stent placement. Electronically Signed   By: Lowella Grip III M.D.   On: 12/11/2017 13:49   Dg Chest Port 1 View  Result Date: 12/11/2017 CLINICAL DATA:  Pleuritic chest pain EXAM: PORTABLE CHEST 1  VIEW COMPARISON:  January 15, 2016 FINDINGS: There is bibasilar atelectasis with small left pleural effusion. There is no edema or consolidation. The heart size and pulmonary vascularity are normal. No adenopathy. There is central peribronchial thickening. IMPRESSION: Chronic central bronchitis.  Bibasilar atelectasis with small left pleural effusion. No edema or consolidation. Heart size normal. Electronically Signed   By: Lowella Grip III M.D.   On: 12/11/2017 08:57   Ct Renal Stone Study  Result Date: 12/10/2017 CLINICAL DATA:  Acute left flank pain. EXAM: CT ABDOMEN AND PELVIS WITHOUT CONTRAST TECHNIQUE: Multidetector CT imaging of the abdomen and pelvis was performed following the standard protocol without IV contrast. COMPARISON:  None. FINDINGS: Lower chest: No acute abnormality. Hepatobiliary: No focal liver abnormality is seen. No gallstones, gallbladder wall thickening, or biliary dilatation. Pancreas: Unremarkable. No pancreatic ductal dilatation or surrounding inflammatory changes. Spleen: Normal in size without focal abnormality. Adrenals/Urinary Tract: Adrenal glands appear normal. Right kidney and ureter appear normal. Moderate left hydroureteronephrosis with perinephric stranding is noted secondary to 3 mm calculus at left ureterovesical junction. Urinary bladder is otherwise unremarkable. Stomach/Bowel: Stomach is within normal limits. Appendix appears normal. No evidence of bowel wall thickening, distention, or inflammatory changes. Vascular/Lymphatic: No significant vascular findings are present. No enlarged abdominal or pelvic lymph nodes. Reproductive: Uterus and bilateral adnexa are unremarkable. Other: Small fat containing left inguinal hernia is noted. No ascites is noted. Musculoskeletal: No acute or significant osseous findings. IMPRESSION: Moderate left hydronephrosis with perinephric stranding is noted secondary to 3 mm distal left ureterovesical junction calculus. Electronically Signed   By: Marijo Conception, M.D.   On: 12/10/2017 15:31    Anti-infectives: Anti-infectives (From admission, onward)   Start     Dose/Rate Route Frequency Ordered Stop   12/11/17 1000  cefTRIAXone (ROCEPHIN) 2 g in sodium chloride 0.9 % 100 mL IVPB     2 g 200 mL/hr over 30 Minutes  Intravenous Every 24 hours 12/11/17 0828     12/10/17 1800  cefTRIAXone (ROCEPHIN) 1 g in sodium chloride 0.9 % 100 mL IVPB     1 g 200 mL/hr over 30 Minutes Intravenous  Once 12/10/17 1745 12/10/17 2237      Assessment/Plan:  1 - Left Ureteral Stone - now temporized with stent for renal decompression. We will arrange for definitive management with ureteroscopy in few weeks in outpatient setting. Surgery request submitted.   2 - Urosepsis / Obstructing Pyelonephritis - continue ABX, IVF, close clinical monitoring. I would rec afebrile x 24 hours and final C/S results before DC home with total course 14 days ABX therapy. DC foley today.   I feel OK for transfer to med surg floor.    Greatly appreciate fam med service co management. Please call me directly with questions anytime. WIll follow PRN at this point.   [facility], Victoria Singh 12/12/2017

## 2017-12-12 NOTE — Treatment Plan (Signed)
Patient complained of SOB and difficulty getting a full breath.  Afebrile, normotensive, non tachy. Sating - 98%  CXR yesterday - bibasilar atelectasis, EKG - NSR  Will order nebs

## 2017-12-12 NOTE — Progress Notes (Addendum)
  Family Medicine Teaching Service Daily Progress Note Intern Pager: [phone]  Patient name: Victoria Singh Medical record number: [id number] Date of birth: [birth year] Age: 52 y.o. Gender: female  Primary Care Provider: Maisie Fus, MD Consultants: urology Code Status: full  Pt Overview and Major Events to Date:  5/26- admitted, stent placed in left ureter 5/28- switch from iv abx to keflex  Assessment and Plan: Victoria Singh is a 52 y.o. female presenting with left flank pain . PMH is significant for rheumatoid arthritis on methotrexate, anemia.  Urosepsis (Improved) Likely secondary to ureteral stone. S/P stent o/n on 5/26 by Urology. Blood cultures and urine culture growing E.Coli. Pan sensitive. Could consider augmentin vs keflex. Keflex with slightly better side-effect profile and believe patient will be compliant with q 6 hour dosing. Will watch overnight, and will likely be medically ready for dc 5/29 pending clinical course. - urology following, appreciate recommendations-> d/c foley, continue abx and ivf - decrease ctx - start keflex 250mg  q 6 hours - continue iv fluids at 47mL/hr - tylenol as needed for fever, pain - de-escalate pain meds from morphine to norco 5mg  q 6 hours  Atelectasis Patient has ambulated some, but had poor posture in bed this am. Only able to do ~860mL on IS this am. Cautioned patient that she is at high risk for pneumonia given her post-operative state even with a cysto case. Gave instruction on how to use IS and stressed importance of ambulation. - Ambulation as tolerated - IS 6 times per hour  Hypotension (resolved) Has greatly improved. 120/91 this am. Ok for transfer to [employer] - monitor bp - ok for transfer to floor  Rheumatoid arthritis Platelet count 77 this am. Will continue holding home mtx and leflunomide. On methotrexate weekly along with folic acid, M42 and leflunomide daily -hold mtx and leflunomide  Hx of Anemia- secondary to  MTX use as above, hemoglobin normal at 13.6 -monitor CBC  FEN/GI: regular diet Prophylaxis: lovenox  Disposition: likely home 5/29 vs 5/30  Subjective:  Feeling better. Some upper abdominal pain thsi   Objective: Temp:  [97.9 F (36.6 C)-99.9 F (37.7 C)] 97.9 F (36.6 C) (05/28 0900) Pulse Rate:  [73-119] 80 (05/28 0900) Resp:  [13-28] 16 (05/28 0900) BP: (102-142)/(56-99) 120/91 (05/28 0900) SpO2:  [91 %-100 %] 92 % (05/28 0900) Physical Exam: General: well appearing, laying in bed in NAD Cardiovascular: RRR no mRG. No chest wall tenderness Respiratory: CTAB. Normal work of breathing.  Abdomen: soft, NDNT Extremities: warm, well perfused, no edema or cyanosis  Laboratory: Recent Labs  Lab 12/10/17 1229 12/11/17 0256 12/12/17 0256  WBC 10.8* 16.3* 14.4*  HGB 13.6 11.9* 11.1*  HCT 42.0 36.1 33.5*  PLT 177 113* 73*   Recent Labs  Lab 12/10/17 1229 12/11/17 0256 12/12/17 0256  NA 137 139 141  K 3.7 3.1* 3.7  CL 104 109 111  CO2 24 22 23   BUN 8 11 12   CREATININE 0.69 0.82 0.62  CALCIUM 9.0 7.6* 8.1*  PROT 7.0  --   --   BILITOT 0.8  --   --   ALKPHOS 72  --   --   ALT 18  --   --   AST 24  --   --   GLUCOSE 122* 153* 116*   Imaging/Diagnostic Tests:   No results found.  Guadalupe Dawn, MD 12/12/2017, 12:10 PM PGY-1, [place] Intern pager: [phone], text pages welcome

## 2017-12-12 NOTE — Progress Notes (Signed)
Urologist notified regarding pt being SOB, not in acute distress. Pts husband concerned for PE/blood clot, I explained S/S pt would likely experience and stated that was not likely the case. The patient complained of chest pain the night before and CXR revealed atelectasis. RN concerned the patient is anxious, feeling like she can not catch a full deep breath. VSS, compliant with mobility and IS use. New order for PRN nebs, will continue to monitor.

## 2017-12-13 ENCOUNTER — Inpatient Hospital Stay (HOSPITAL_COMMUNITY): Payer: BC Managed Care – PPO

## 2017-12-13 DIAGNOSIS — R0602 Shortness of breath: Secondary | ICD-10-CM

## 2017-12-13 DIAGNOSIS — R0781 Pleurodynia: Secondary | ICD-10-CM

## 2017-12-13 DIAGNOSIS — J189 Pneumonia, unspecified organism: Secondary | ICD-10-CM

## 2017-12-13 LAB — BASIC METABOLIC PANEL
ANION GAP: 10 (ref 5–15)
BUN: 7 mg/dL (ref 6–20)
CALCIUM: 8 mg/dL — AB (ref 8.9–10.3)
CHLORIDE: 105 mmol/L (ref 101–111)
CO2: 23 mmol/L (ref 22–32)
Creatinine, Ser: 0.53 mg/dL (ref 0.44–1.00)
GFR calc non Af Amer: >60 mL/min (ref >60)
Glucose, Bld: 88 mg/dL (ref 65–99)
POTASSIUM: 3.5 mmol/L (ref 3.5–5.1)
Sodium: 138 mmol/L (ref 135–145)

## 2017-12-13 LAB — CBC
HEMATOCRIT: 33.8 % — AB (ref 36.0–46.0)
HEMOGLOBIN: 11.2 g/dL — AB (ref 12.0–15.0)
MCH: 28.5 pg (ref 26.0–34.0)
MCHC: 33.1 g/dL (ref 30.0–36.0)
MCV: 86 fL (ref 78.0–100.0)
Platelets: 102 10*3/uL — ABNORMAL LOW (ref 150–400)
RBC: 3.93 MIL/uL (ref 3.87–5.11)
RDW: 13.2 % (ref 11.5–15.5)
WBC: 16.2 10*3/uL — AB (ref 4.0–10.5)

## 2017-12-13 LAB — CULTURE, BLOOD (ROUTINE X 2)
SPECIAL REQUESTS: ADEQUATE
Special Requests: ADEQUATE

## 2017-12-13 LAB — BRAIN NATRIURETIC PEPTIDE: B Natriuretic Peptide: 959.3 pg/mL — ABNORMAL HIGH (ref 0.0–100.0)

## 2017-12-13 MED ORDER — IBUPROFEN 600 MG PO TABS: 600.0000 mg | ORAL_TABLET | Freq: Four times a day (QID) | ORAL | Status: DC | PRN

## 2017-12-13 MED ORDER — FUROSEMIDE 10 MG/ML IJ SOLN
40.0000 mg | Freq: Once | INTRAMUSCULAR | Status: AC
  Administered 2017-12-13: 40 mg via INTRAVENOUS
  Filled 2017-12-13: qty 4

## 2017-12-13 MED ORDER — ALUM & MAG HYDROXIDE-SIMETH 200-200-20 MG/5ML PO SUSP: 30.0000 mL | ORAL | Status: DC | PRN

## 2017-12-13 MED ORDER — AMOXICILLIN-POT CLAVULANATE 875-125 MG PO TABS
1.0000 | ORAL_TABLET | Freq: Two times a day (BID) | ORAL | Status: DC
  Administered 2017-12-13 – 2017-12-14 (×3): 1 via ORAL
  Filled 2017-12-13 (×3): qty 1

## 2017-12-13 NOTE — Plan of Care (Signed)
  Problem: Clinical Measurements: Goal: Will remain free from infection Outcome: Progressing Goal: Diagnostic test results will improve Outcome: Progressing Goal: Cardiovascular complication will be avoided Outcome: Progressing   Problem: Activity: Goal: Risk for activity intolerance will decrease Outcome: Progressing   Problem: Elimination: Goal: Will not experience complications related to bowel motility Outcome: Progressing Goal: Will not experience complications related to urinary retention Outcome: Progressing   Problem: Pain Managment: Goal: General experience of comfort will improve Outcome: Progressing   Problem: Safety: Goal: Ability to remain free from injury will improve Outcome: Progressing   Problem: Clinical Measurements: Goal: Will remain free from infection Outcome: Progressing Goal: Diagnostic test results will improve Outcome: Progressing Goal: Cardiovascular complication will be avoided Outcome: Progressing   Problem: Activity: Goal: Risk for activity intolerance will decrease Outcome: Progressing   Problem: Elimination: Goal: Will not experience complications related to bowel motility Outcome: Progressing Goal: Will not experience complications related to urinary retention Outcome: Progressing   Problem: Pain Managment: Goal: General experience of comfort will improve Outcome: Progressing   Problem: Safety: Goal: Ability to remain free from injury will improve Outcome: Progressing

## 2017-12-13 NOTE — H&P (Signed)
**Completed on 12/10/17.  Signed as Progress Note rather than HP.     [facility] Admission History and Physical Service Pager: [phone]  Patient name: Victoria Singh Medical record number: [id number] Date of birth: 1965/10/20        Age: 52 y.o.    Gender: female  Primary Care Provider: Maisie Fus, MD Consultants: urology Code Status: full  Chief Complaint: left flank pain  Assessment and Plan: Victoria Singh is a 52 y.o. female presenting with left flank pain . PMH is significant for rheumatoid arthritis on methotrexate, anemia.  Left flank pain- secondary to renal stone and infection. Renal stone study demonstrates 3 mm stone in left distal ureter with perinephric stranding and moderate hydronephrosis. Urology consulted in ED with plans to stent ureter. ED provider initiated sepsis protocol given tachycardia, fever, source of infection. UA with leukocytes, Gram stain w/ multiple bacteria seen. Lactic acid 1.8, WBC count 10.8. Patient without IV access at time of interview but IV team on the way. S/p 1 L NS bolus but continues to look dry and remains tachycardic. -admit to inpatient, attending Dr. Mingo Amber -patient stable for floor, vitals per floor routine -continue CTX, await urine cx -blood cx pending as well -monitor for fevers, worsening illness -once IV established proceed with second NS bolus -then continue IVF at 100/hr -tylenol as needed for fever, pain -morphine 4 mg q4 hours as needed -trend lactic acid  Rheumatoid arthritis- on methotrexate weekly along with folic acid, I20 and leflunomide daily -continue home meds -monitor CBC  Hx of Anemia- secondary to MTX use as above, hemoglobin normal at 13.6 -monitor CBC  FEN/GI: NPO for procedure then regular diet Prophylaxis: lovenox  Disposition: admit to inpatient for stent and antibiotics  History of Present Illness:  Victoria Singh is a 52 y.o. female presenting with flank  pain.   Patient reports she was in her usual state of health yesterday however felt tired last night earlier than usual and went to bed. Awoke at 6 am with left sided pain that was severe. She went to bathroom and had bowel movement and normal urine output without any pain or blood noted. She continued to feel poorly and have pain. She took tylenol with no relief. She was trying to drink water but was unable to keep it down without vomiting, so she came to the ED. In ED was found to have left sided uretal stone with evidence of perinephric stranding. She became tachycardic and febrile.   Review Of Systems: Per HPI with the following additions:   Review of Systems  Constitutional: Positive for chills and fever. Negative for diaphoresis, malaise/fatigue and weight loss.  HENT: Negative for congestion and sore throat.   Eyes: Negative for blurred vision and double vision.  Respiratory: Negative for cough, shortness of breath and wheezing.   Cardiovascular: Negative for chest pain and palpitations.  Gastrointestinal: Positive for nausea and vomiting. Negative for abdominal pain, blood in stool, constipation, diarrhea, heartburn and melena.  Genitourinary: Positive for flank pain. Negative for dysuria, frequency, hematuria and urgency.  Musculoskeletal: Negative for back pain, falls and myalgias.  Skin: Negative for rash.  Neurological: Negative for focal weakness, seizures, weakness and headaches.  Psychiatric/Behavioral: Negative for substance abuse.        Patient Active Problem List   Diagnosis Date Noted  . Pyelonephritis 12/10/2017  . Nephrolithiasis   . Rheumatoid arthritis involving multiple sites with positive rheumatoid factor ([address])   . Immunocompromised (  [place])   . Personal history of colonic polyp - adenoma 01/30/2014    Past Medical History:     Past Medical History:  Diagnosis Date  . Anemia   . Personal history of colonic polyp - adenoma 01/30/2014  . Rheumatoid  arthritis [facility])     Past Surgical History:      Past Surgical History:  Procedure Laterality Date  . DIAGNOSTIC LAPAROSCOPY  1992  . TUBAL LIGATION  2004    Social History: Social History       Tobacco Use  . Smoking status: Never Smoker  . Smokeless tobacco: Never Used  Substance Use Topics  . Alcohol use: No  . Drug use: No   Additional social history: lives with husband and daughter  Please also refer to relevant sections of EMR.  Family History:      Family History  Problem Relation Age of Onset  . Colon cancer Neg Hx    Allergies and Medications:     Allergies  Allergen Reactions  . Nitrofurantoin Anaphylaxis and Itching  . Sulfa Antibiotics Itching and Swelling   No current facility-administered medications on file prior to encounter.          Current Outpatient Medications on File Prior to Encounter  Medication Sig Dispense Refill  . acetaminophen (TYLENOL) 500 MG tablet Take 1,000 mg by mouth as needed for mild pain.    Marland Kitchen CRANBERRY PO Take by mouth daily.    . Cyanocobalamin (B-12 PO) Take by mouth daily.    . folic acid (FOLVITE) 1 MG tablet Take 1 mg by mouth daily.    Marland Kitchen FOLIC ACID PO Take by mouth daily.    Marland Kitchen leflunomide (ARAVA) 10 MG tablet Take 10 mg by mouth daily.    . methotrexate (RHEUMATREX) 2.5 MG tablet Take 7.5 mg by mouth once a week. Caution:Chemotherapy. Protect from light. Takes 8 tablets once a week     . Multiple Vitamin (MULTIVITAMIN) tablet Take 1 tablet by mouth daily.      Objective: BP 125/71   Pulse (!) 122   Temp (!) 104.4 F (40.2 C) (Rectal)   Resp 16   LMP 01/04/2014   SpO2 92%  Exam: General: pleasant lady laying in bed in NAD Eyes: PERRL, EOMI ENTM: dry mucous membranes Neck: supple, non-tender, no LAD Cardiovascular: tachycardic, regular rhythm, no MRG Respiratory: CTAB. Normal work of breathing. Gastrointestinal: soft, non-tender, non-distended, no CVA tenderness MSK: moves  all extremities equally, no edema or cyanosis Derm: skin is warm to touch, no rashes or lesions Neuro: no focal deficits Psych: mood is calm, affect appropriate  Labs and Imaging: CBC BMET  LastLabs     Recent Labs  Lab 12/10/17 1229  WBC 10.8*  HGB 13.6  HCT 42.0  PLT 177     LastLabs     Recent Labs  Lab 12/10/17 1229  NA 137  K 3.7  CL 104  CO2 24  BUN 8  CREATININE 0.69  GLUCOSE 122*  CALCIUM 9.0       Ct Renal Stone Study  Result Date: 12/10/2017 CLINICAL DATA:  Acute left flank pain. EXAM: CT ABDOMEN AND PELVIS WITHOUT CONTRAST TECHNIQUE: Multidetector CT imaging of the abdomen and pelvis was performed following the standard protocol without IV contrast. COMPARISON:  None. FINDINGS: Lower chest: No acute abnormality. Hepatobiliary: No focal liver abnormality is seen. No gallstones, gallbladder wall thickening, or biliary dilatation. Pancreas: Unremarkable. No pancreatic ductal dilatation or surrounding inflammatory changes. Spleen: Normal in  size without focal abnormality. Adrenals/Urinary Tract: Adrenal glands appear normal. Right kidney and ureter appear normal. Moderate left hydroureteronephrosis with perinephric stranding is noted secondary to 3 mm calculus at left ureterovesical junction. Urinary bladder is otherwise unremarkable. Stomach/Bowel: Stomach is within normal limits. Appendix appears normal. No evidence of bowel wall thickening, distention, or inflammatory changes. Vascular/Lymphatic: No significant vascular findings are present. No enlarged abdominal or pelvic lymph nodes. Reproductive: Uterus and bilateral adnexa are unremarkable. Other: Small fat containing left inguinal hernia is noted. No ascites is noted. Musculoskeletal: No acute or significant osseous findings. IMPRESSION: Moderate left hydronephrosis with perinephric stranding is noted secondary to 3 mm distal left ureterovesical junction calculus. Electronically Signed   By: Marijo Conception,  M.D.   On: 12/10/2017 15:31     Steve Rattler, DO 12/10/2017, 6:46 PM PGY-2, [place] Intern pager: [phone], text pages welcome           Cosigned by: Alveda Reasons, MD at 12/11/2017 12:08 PM  Revision History

## 2017-12-13 NOTE — Progress Notes (Signed)
Family Medicine Teaching Service Daily Progress Note Intern Pager: [phone]  Patient name: Victoria Singh Medical record number: [id number] Date of birth: October 03, 1965 Age: 52 y.o. Gender: female  Primary Care Provider: Maisie Fus, MD Consultants: urology Code Status: full  Pt Overview and Major Events to Date:  5/26- admitted, stent placed in left ureter 5/28- switch from iv abx to keflex  Assessment and Plan: Victoria Singh is a 52 y.o. female presenting with left flank pain . PMH is significant for rheumatoid arthritis on methotrexate, anemia.  Urosepsis (Improved) Likely secondary to ureteral stone. S/P stent o/n on 5/26 by Urology. Blood cultures and urine culture growing E.Coli. Pan sensitive. Given patient worsening respiratory status from 5/28 will switch to augmentin to cover for typical pulmonary flora. Will watch over night for improvement. Hopeful for dc on 5/30 if improved. - urology following, appreciate recommendations-> 14 days of abx - discontinue keflex - switch to augmentin 875/125mg  bid - saline lock Ivf - tylenol as needed for fever, pain - continue norco 5mg  q 6 hours  Atelectasis  suspicion for pulmonary infection Patient with shortness of breath overnight. Relieved by breathing treatments. Increased crackles on LLL this am. CXR with worsening L pleural effusion suspicious for pneumonia vs atelectasis. Still with poor IS output (700-869mL). Will switch to Augmentin to cover typical respiratory flora. - Augmentin 875/125 bid - Ambulation as tolerated - IS 6 times per hour - albuterol q 4 hours  Hypotension (resolved) Has greatly improved. 121/87 this am - monitor bp  Rheumatoid arthritis Platelet count 102 this am. Will continue holding home mtx and leflunomide. On methotrexate weekly along with folic acid, S85 and leflunomide daily -hold mtx and leflunomide  Hx of Anemia- secondary to MTX use as above, hemoglobin slightly low at 11.2 -monitor  CBC  FEN/GI: regular diet Prophylaxis: lovenox  Disposition: likely home 5/30 vs 5/31  Subjective:  Still with some mild SOB, but feels like albuterol helped. Able to ambulate a little bit. Tolerating PO.  Objective: Temp:  [98.2 F (36.8 C)-99.4 F (37.4 C)] 98.2 F (36.8 C) (05/29 1344) Pulse Rate:  [80-103] 90 (05/29 1344) Resp:  [15-23] 18 (05/29 1344) BP: (121-146)/(83-96) 121/87 (05/29 1344) SpO2:  [90 %-98 %] 91 % (05/29 1344) Weight:  [185 lb 3 oz (84 kg)] 185 lb 3 oz (84 kg) (05/29 0238) Physical Exam: General: well appearing, middle aged caucasian female, laying comfortally Cardiovascular: slight tachycardia, no m/r/g. No chest wall tenderness Respiratory: crackles in bilateral lungs L>R, slightly increased work of breathing Abdomen: soft, non tender, non-distended Extremities: warm, well perfused, no edema or cyanosis  Laboratory: Recent Labs  Lab 12/11/17 0256 12/12/17 0256 12/13/17 0558  WBC 16.3* 14.4* 16.2*  HGB 11.9* 11.1* 11.2*  HCT 36.1 33.5* 33.8*  PLT 113* 73* 102*   Recent Labs  Lab 12/10/17 1229 12/11/17 0256 12/12/17 0256 12/13/17 0558  NA 137 139 141 138  K 3.7 3.1* 3.7 3.5  CL 104 109 111 105  CO2 24 22 23 23   BUN 8 11 12 7   CREATININE 0.69 0.82 0.62 0.53  CALCIUM 9.0 7.6* 8.1* 8.0*  PROT 7.0  --   --   --   BILITOT 0.8  --   --   --   ALKPHOS 72  --   --   --   ALT 18  --   --   --   AST 24  --   --   --   GLUCOSE 122* 153*  116* 88   Imaging/Diagnostic Tests:   Dg Chest 2 View  Result Date: 12/13/2017 CLINICAL DATA:  Shortness of breath. Patient status post left ureteral stent placement for a kidney stone 12/11/2017. EXAM: CHEST - 2 VIEW COMPARISON:  Single-view of the chest 12/11/2017. PA and lateral chest 01/15/2016. FINDINGS: There small bilateral pleural effusions. Basilar airspace disease is worse on the left. Heart size is normal. No pneumothorax. Left double-J ureteral stent is partially imaged. IMPRESSION: Small  bilateral pleural effusions and basilar airspace disease are worse on the left. Airspace disease is likely atelectasis but could be secondary to pneumonia. Electronically Signed   By: Inge Rise M.D.   On: 12/13/2017 12:09    Guadalupe Dawn, MD 12/13/2017, 2:37 PM PGY-1, [place] Intern pager: [phone], text pages welcome

## 2017-12-14 DIAGNOSIS — N10 Acute tubulo-interstitial nephritis: Secondary | ICD-10-CM

## 2017-12-14 DIAGNOSIS — Z419 Encounter for procedure for purposes other than remedying health state, unspecified: Secondary | ICD-10-CM

## 2017-12-14 LAB — BASIC METABOLIC PANEL
ANION GAP: 11 (ref 5–15)
BUN: <5 mg/dL — ABNORMAL LOW (ref 6–20)
CHLORIDE: 100 mmol/L — AB (ref 101–111)
CO2: 28 mmol/L (ref 22–32)
Calcium: 8.2 mg/dL — ABNORMAL LOW (ref 8.9–10.3)
Creatinine, Ser: 0.6 mg/dL (ref 0.44–1.00)
GFR calc Af Amer: >60 mL/min (ref >60)
GFR calc non Af Amer: >60 mL/min (ref >60)
Glucose, Bld: 102 mg/dL — ABNORMAL HIGH (ref 65–99)
POTASSIUM: 3.5 mmol/L (ref 3.5–5.1)
Sodium: 139 mmol/L (ref 135–145)

## 2017-12-14 LAB — CBC
HEMATOCRIT: 37.3 % (ref 36.0–46.0)
HEMOGLOBIN: 12.3 g/dL (ref 12.0–15.0)
MCH: 28 pg (ref 26.0–34.0)
MCHC: 33 g/dL (ref 30.0–36.0)
MCV: 85 fL (ref 78.0–100.0)
Platelets: 129 10*3/uL — ABNORMAL LOW (ref 150–400)
RBC: 4.39 MIL/uL (ref 3.87–5.11)
RDW: 13.2 % (ref 11.5–15.5)
WBC: 9.4 10*3/uL (ref 4.0–10.5)

## 2017-12-14 MED ORDER — HYDROCODONE-ACETAMINOPHEN 5-325 MG PO TABS: 1.0000 | ORAL_TABLET | Freq: Four times a day (QID) | ORAL | 0 refills | Status: DC | PRN

## 2017-12-14 MED ORDER — AMOXICILLIN-POT CLAVULANATE 875-125 MG PO TABS: 1.0000 | ORAL_TABLET | Freq: Two times a day (BID) | ORAL | 0 refills | Status: AC

## 2017-12-14 MED ORDER — ONDANSETRON HCL 4 MG PO TABS: 4.0000 mg | ORAL_TABLET | Freq: Four times a day (QID) | ORAL | 0 refills | Status: AC | PRN

## 2017-12-14 NOTE — Discharge Summary (Signed)
[facility] Discharge Summary  Patient name: Victoria Singh Medical record number: [id number] Date of birth: Jun 28, 1966 Age: 52 y.o. Gender: female Date of Admission: 12/10/2017  Date of Discharge: 12/14/2017 Admitting Physician: Alexis Frock, MD  Primary Care Provider: Maisie Fus, MD Consultants: Urology  Indication for Hospitalization: Sepsis  Discharge Diagnoses/Problem List:  Urosepsis Atelectasis Suspicion for LLL pulmonary infection Hypotension Rheumatoid Arthritis History of Anemia  Disposition: home  Discharge Condition: stable  Discharge Exam: General: well appearing, middle aged caucasian female, laying comfortally Cardiovascular: slight tachycardia, no m/r/g. No chest wall tenderness Respiratory: Improving crackles in bilateral lungs L>R, slightly increased work of breathing Abdomen: soft, non tender, non-distended Extremities: warm, well perfused, no edema or cyanosis  Brief Hospital Course:  52 year old female who presented on 5/26 with sepsis secondary to a left ureteral stent kidney stone. Patient also noted to have pyelonephritis at that time via renal stone ct scan. Patient taken overnight to OR to have stent placed in left ureter.    Pyelonephritis/Sepsis Patient was continued on antibiotics after stent placement. Initially on Iv ceftriaxone and was weaned to oral keflex after cultures and sensitivities resulted. Patient also had positive blood cultures. Both urine and blood cultures grew out E. coli that was pan-sensitive. Initially switched to oral keflex for one day. Due to increased shortness of breath and increasing focal findings in LLL patient was switched to augmentin to complete her course to cover for additional respiratory flora.  Atelectasis  ?LLL pneumonia On POD#3 patient did develop increased respiratory effort. A chest xray showed bilateral pleural effusions L>R. Concern for developing pneumonia so patient was  switched to augmentin to cover for respiratory flora. Patient was similarly felt to be volume overloaded. A BNP was drawn which was around 900. Patient given a dose of lasix. Had 2L urine output and resolution of respiratory symptoms.  Issues for Follow Up:  1. Ensure patient completed course of antibiotics 2. Follow up with urology for stent removal  Significant Procedures: cystoscopy, left retrograde pyelogram, Left Ureteral stent placement  Significant Labs and Imaging:  Recent Labs  Lab 12/12/17 0256 12/13/17 0558 12/14/17 0451  WBC 14.4* 16.2* 9.4  HGB 11.1* 11.2* 12.3  HCT 33.5* 33.8* 37.3  PLT 73* 102* 129*   Recent Labs  Lab 12/10/17 1229 12/11/17 0256 12/12/17 0256 12/13/17 0558 12/14/17 0451  NA 137 139 141 138 139  K 3.7 3.1* 3.7 3.5 3.5  CL 104 109 111 105 100*  CO2 24 22 23 23 28   GLUCOSE 122* 153* 116* 88 102*  BUN 8 11 12 7  <5*  CREATININE 0.69 0.82 0.62 0.53 0.60  CALCIUM 9.0 7.6* 8.1* 8.0* 8.2*  ALKPHOS 72  --   --   --   --   AST 24  --   --   --   --   ALT 18  --   --   --   --   ALBUMIN 4.0  --   --   --   --     Results/Tests Pending at Time of Discharge:   Discharge Medications:  Allergies as of 12/14/2017      Reactions   Nitrofurantoin Anaphylaxis, Itching   Sulfa Antibiotics Itching, Swelling      Medication List    TAKE these medications   acetaminophen 500 MG tablet Commonly known as:  TYLENOL Take 1,000 mg by mouth as needed for mild pain.   amoxicillin-clavulanate 875-125 MG tablet Commonly known  as:  AUGMENTIN Take 1 tablet by mouth 2 (two) times daily for 9 days.   B-12 PO Take by mouth daily.   CRANBERRY PO Take by mouth daily.   FOLIC ACID PO Take by mouth daily.   folic acid 1 MG tablet Commonly known as:  FOLVITE Take 1 mg by mouth daily.   HYDROcodone-acetaminophen 5-325 MG tablet Commonly known as:  NORCO/VICODIN Take 1 tablet by mouth every 6 (six) hours as needed for severe pain.   leflunomide 10 MG  tablet Commonly known as:  ARAVA Take 10 mg by mouth daily.   methotrexate 2.5 MG tablet Commonly known as:  RHEUMATREX Take 7.5 mg by mouth once a week. Caution:Chemotherapy. Protect from light. Takes 8 tablets once a week   multivitamin tablet Take 1 tablet by mouth daily.   ondansetron 4 MG tablet Commonly known as:  ZOFRAN Take 1 tablet (4 mg total) by mouth every 6 (six) hours as needed for nausea.       Discharge Instructions: Please refer to Patient Instructions section of EMR for full details.  Patient was counseled important signs and symptoms that should prompt return to medical care, changes in medications, dietary instructions, activity restrictions, and follow up appointments.   Follow-Up Appointments: Follow-up Information    Schedule an appointment as soon as possible for a visit  with Alexis Frock, MD.   Specialty:  Urology Contact information: [place] [address] [phone]           Guadalupe Dawn, MD 12/14/2017, 2:45 PM PGY-1, [place]

## 2017-12-14 NOTE — Progress Notes (Signed)
Pt for discharge going home discontinued peripheral IV line, health teachings and due meds explained and understood, next appointment, given all her personal belongings, no complain of pain at this time, waiting for the family to pick her up.

## 2017-12-14 NOTE — Progress Notes (Signed)
FMTS Attending Daily Note:  S: Feels much better today.  No further pain.  Some nausea with taking abx last night, none today.  Breathing back to normal.  O: Gen:  Alert, cooperative patient who appears stated age in no acute distress.  Vital signs reviewed. Lungs:  Clear throughout Abd:  S/ND/NT Ext:  No LE edema  Imp/Plan 1.  Urosepsis - resolved.  DC home today with Augmentin due to improvement.  - FU outpt urology  2.  Pleural effusions - resolved s/p 1 x dose lasix - augmentin would also cover for any respiratory bugs  3.  DIspo: - DC home today.   Alveda Reasons, MD 12/14/2017 1:19 PM

## 2017-12-26 NOTE — Progress Notes (Signed)
12-13-17 (Epic) EKG, CXR

## 2017-12-26 NOTE — Patient Instructions (Addendum)
Victoria Singh  12/26/2017   Your procedure is scheduled on: 01-03-18   Report to [facility] Main  Entrance    Report to Admitting at 1:15 PM    Call this number if you have problems the morning of surgery [phone]   Remember: Do not eat food or drink liquids :After Midnight. You may have a Clear Liquid Diet from Midnight until 9:45 AM. After 9:45 AM, nothing until after surgery.     CLEAR LIQUID DIET   Foods Allowed                                                                     Foods Excluded  Coffee and tea, regular and decaf                             liquids that you cannot  Plain Jell-O in any flavor                                             see through such as: Fruit ices (not with fruit pulp)                                     milk, soups, orange juice  Iced Popsicles                                    All solid food Carbonated beverages, regular and diet                                    Cranberry, grape and apple juices Sports drinks like Gatorade Lightly seasoned clear broth or consume(fat free) Sugar, honey syrup  Sample Menu Breakfast                                Lunch                                     Supper Cranberry juice                    Beef broth                            Chicken broth Jell-O                                     Grape juice  Apple juice Coffee or tea                        Jell-O                                      Popsicle                                                Coffee or tea                        Coffee or tea  _____________________________________________________________________    Take these medicines the morning of surgery with A SIP OF WATER: None                                You may not have any metal on your body including hair pins and              piercings  Do not wear jewelry, make-up, lotions, powders or perfumes, deodorant             Do not wear  nail polish.  Do not shave  48 hours prior to surgery.         .   Do not bring valuables to the hospital. [employer].  Contacts, dentures or bridgework may not be worn into surgery.  .     Patients discharged the day of surgery will not be allowed to drive home.  Name and phone number of your driver: Victoria Singh [phone]                Please read over the following fact sheets you were given: _____________________________________________________________________             [facility] - Preparing for Surgery Before surgery, you can play an important role.  Because skin is not sterile, your skin needs to be as free of germs as possible.  You can reduce the number of germs on your skin by washing with CHG (chlorahexidine gluconate) soap before surgery.  CHG is an antiseptic cleaner which kills germs and bonds with the skin to continue killing germs even after washing. Please DO NOT use if you have an allergy to CHG or antibacterial soaps.  If your skin becomes reddened/irritated stop using the CHG and inform your nurse when you arrive at Short Stay. Do not shave (including legs and underarms) for at least 48 hours prior to the first CHG shower.  You may shave your face/neck. Please follow these instructions carefully:  1.  Shower with CHG Soap the night before surgery and the  morning of Surgery.  2.  If you choose to wash your hair, wash your hair first as usual with your  normal  shampoo.  3.  After you shampoo, rinse your hair and body thoroughly to remove the  shampoo.                           4.  Use CHG as you  would any other liquid soap.  You can apply chg directly  to the skin and wash                       Gently with a scrungie or clean washcloth.  5.  Apply the CHG Soap to your body ONLY FROM THE NECK DOWN.   Do not use on face/ open                           Wound or open sores. Avoid contact with eyes, ears mouth  and genitals (private parts).                       Wash face,  Genitals (private parts) with your normal soap.             6.  Wash thoroughly, paying special attention to the area where your surgery  will be performed.  7.  Thoroughly rinse your body with warm water from the neck down.  8.  DO NOT shower/wash with your normal soap after using and rinsing off  the CHG Soap.                9.  Pat yourself dry with a clean towel.            10.  Wear clean pajamas.            11.  Place clean sheets on your bed the night of your first shower and do not  sleep with pets. Day of Surgery : Do not apply any lotions/deodorants the morning of surgery.  Please wear clean clothes to the hospital/surgery center.  FAILURE TO FOLLOW THESE INSTRUCTIONS MAY RESULT IN THE CANCELLATION OF YOUR SURGERY PATIENT SIGNATURE_________________________________  NURSE SIGNATURE__________________________________  ________________________________________________________________________

## 2017-12-27 ENCOUNTER — Encounter (HOSPITAL_COMMUNITY)
Admission: RE | Admit: 2017-12-27 | Discharge: 2017-12-27 | Disposition: A | Discharge status: Home / Self Care | Payer: BC Managed Care – PPO | Source: Ambulatory Visit | Attending: Urology | Admitting: Urology

## 2017-12-27 ENCOUNTER — Encounter (HOSPITAL_COMMUNITY): Payer: Self-pay

## 2017-12-27 ENCOUNTER — Ambulatory Visit (HOSPITAL_COMMUNITY)
Admission: RE | Admit: 2017-12-27 | Discharge: 2017-12-27 | Disposition: A | Discharge status: Home / Self Care | Payer: BC Managed Care – PPO | Source: Ambulatory Visit | Attending: Anesthesiology | Admitting: Anesthesiology

## 2017-12-27 ENCOUNTER — Other Ambulatory Visit: Payer: Self-pay

## 2017-12-27 DIAGNOSIS — Z01818 Encounter for other preprocedural examination: Secondary | ICD-10-CM | POA: Insufficient documentation

## 2017-12-27 DIAGNOSIS — N201 Calculus of ureter: Secondary | ICD-10-CM | POA: Insufficient documentation

## 2017-12-27 DIAGNOSIS — Z01812 Encounter for preprocedural laboratory examination: Secondary | ICD-10-CM | POA: Diagnosis present

## 2017-12-27 DIAGNOSIS — Z01811 Encounter for preprocedural respiratory examination: Secondary | ICD-10-CM

## 2017-12-27 HISTORY — DX: Pneumonia, unspecified organism: J18.9

## 2017-12-27 HISTORY — DX: Nausea with vomiting, unspecified: R11.2

## 2017-12-27 HISTORY — DX: Other complications of anesthesia, initial encounter: T88.59XA

## 2017-12-27 HISTORY — DX: Adverse effect of unspecified anesthetic, initial encounter: T41.45XA

## 2017-12-27 HISTORY — DX: Other specified postprocedural states: Z98.890

## 2017-12-27 LAB — CBC
HCT: 41.7 % (ref 36.0–46.0)
Hemoglobin: 13.6 g/dL (ref 12.0–15.0)
MCH: 28.9 pg (ref 26.0–34.0)
MCHC: 32.6 g/dL (ref 30.0–36.0)
MCV: 88.5 fL (ref 78.0–100.0)
PLATELETS: 300 10*3/uL (ref 150–400)
RBC: 4.71 MIL/uL (ref 3.87–5.11)
RDW: 13.6 % (ref 11.5–15.5)
WBC: 4.1 10*3/uL (ref 4.0–10.5)

## 2017-12-27 LAB — BASIC METABOLIC PANEL
Anion gap: 7 (ref 5–15)
BUN: 10 mg/dL (ref 6–20)
CHLORIDE: 105 mmol/L (ref 101–111)
CO2: 29 mmol/L (ref 22–32)
CREATININE: 0.52 mg/dL (ref 0.44–1.00)
Calcium: 9.4 mg/dL (ref 8.9–10.3)
GFR calc Af Amer: >60 mL/min (ref >60)
GFR calc non Af Amer: >60 mL/min (ref >60)
Glucose, Bld: 104 mg/dL — ABNORMAL HIGH (ref 65–99)
Potassium: 4.3 mmol/L (ref 3.5–5.1)
Sodium: 141 mmol/L (ref 135–145)

## 2018-01-02 MED ORDER — GENTAMICIN SULFATE 40 MG/ML IJ SOLN
5.0000 mg/kg | INTRAVENOUS | Status: AC
  Administered 2018-01-03: 320 mg via INTRAVENOUS
  Filled 2018-01-02: qty 8

## 2018-01-03 ENCOUNTER — Ambulatory Visit (HOSPITAL_COMMUNITY): Payer: BC Managed Care – PPO | Admitting: Anesthesiology

## 2018-01-03 ENCOUNTER — Ambulatory Visit (HOSPITAL_COMMUNITY)
Admission: RE | Admit: 2018-01-03 | Discharge: 2018-01-03 | Disposition: A | Discharge status: Home / Self Care | Payer: BC Managed Care – PPO | Source: Ambulatory Visit | Attending: Urology | Admitting: Urology

## 2018-01-03 ENCOUNTER — Ambulatory Visit (HOSPITAL_COMMUNITY): Payer: BC Managed Care – PPO

## 2018-01-03 ENCOUNTER — Encounter (HOSPITAL_COMMUNITY): Payer: Self-pay | Admitting: General Practice

## 2018-01-03 ENCOUNTER — Encounter (HOSPITAL_COMMUNITY)
Admission: RE | Disposition: A | Discharge status: Home / Self Care | Payer: Self-pay | Source: Ambulatory Visit | Attending: Urology

## 2018-01-03 DIAGNOSIS — M069 Rheumatoid arthritis, unspecified: Secondary | ICD-10-CM | POA: Insufficient documentation

## 2018-01-03 DIAGNOSIS — N201 Calculus of ureter: Secondary | ICD-10-CM | POA: Insufficient documentation

## 2018-01-03 HISTORY — PX: CYSTOSCOPY/URETEROSCOPY/HOLMIUM LASER/STENT PLACEMENT: SHX6546

## 2018-01-03 SURGERY — CYSTOSCOPY/URETEROSCOPY/HOLMIUM LASER/STENT PLACEMENT
Anesthesia: General | Laterality: Left

## 2018-01-03 MED ORDER — MIDAZOLAM HCL 5 MG/5ML IJ SOLN
INTRAMUSCULAR | Status: DC | PRN
  Administered 2018-01-03: 2 mg via INTRAVENOUS

## 2018-01-03 MED ORDER — OXYCODONE HCL 5 MG/5ML PO SOLN
5.0000 mg | Freq: Once | ORAL | Status: DC | PRN
  Filled 2018-01-03: qty 5

## 2018-01-03 MED ORDER — IOHEXOL 300 MG/ML  SOLN
INTRAMUSCULAR | Status: DC | PRN
  Administered 2018-01-03: 8 mL via URETHRAL

## 2018-01-03 MED ORDER — PROPOFOL 10 MG/ML IV BOLUS
INTRAVENOUS | Status: AC
  Filled 2018-01-03: qty 20

## 2018-01-03 MED ORDER — FENTANYL CITRATE (PF) 100 MCG/2ML IJ SOLN
INTRAMUSCULAR | Status: DC | PRN
  Administered 2018-01-03: 50 ug via INTRAVENOUS

## 2018-01-03 MED ORDER — OXYCODONE HCL 5 MG PO TABS: 5.0000 mg | ORAL_TABLET | Freq: Once | ORAL | Status: DC | PRN

## 2018-01-03 MED ORDER — PROMETHAZINE HCL 25 MG/ML IJ SOLN: 6.2500 mg | INTRAMUSCULAR | Status: DC | PRN

## 2018-01-03 MED ORDER — KETOROLAC TROMETHAMINE 30 MG/ML IJ SOLN: 30.0000 mg | Freq: Once | INTRAMUSCULAR | Status: DC | PRN

## 2018-01-03 MED ORDER — ONDANSETRON HCL 4 MG/2ML IJ SOLN
INTRAMUSCULAR | Status: AC
  Filled 2018-01-03: qty 2

## 2018-01-03 MED ORDER — HYDROCODONE-ACETAMINOPHEN 5-325 MG PO TABS: 1.0000 | ORAL_TABLET | Freq: Four times a day (QID) | ORAL | 0 refills | Status: AC | PRN

## 2018-01-03 MED ORDER — HYDROCODONE-ACETAMINOPHEN 5-325 MG PO TABS
ORAL_TABLET | ORAL | Status: AC
  Filled 2018-01-03: qty 1

## 2018-01-03 MED ORDER — LACTATED RINGERS IV SOLN
INTRAVENOUS | Status: DC | PRN
  Administered 2018-01-03: 14:00:00 via INTRAVENOUS

## 2018-01-03 MED ORDER — HYDROCODONE-ACETAMINOPHEN 5-325 MG PO TABS
1.0000 | ORAL_TABLET | Freq: Four times a day (QID) | ORAL | Status: DC | PRN
  Administered 2018-01-03: 1 via ORAL

## 2018-01-03 MED ORDER — FENTANYL CITRATE (PF) 100 MCG/2ML IJ SOLN
INTRAMUSCULAR | Status: AC
  Filled 2018-01-03: qty 2

## 2018-01-03 MED ORDER — DEXAMETHASONE SODIUM PHOSPHATE 10 MG/ML IJ SOLN
INTRAMUSCULAR | Status: DC | PRN
  Administered 2018-01-03: 10 mg via INTRAVENOUS

## 2018-01-03 MED ORDER — FENTANYL CITRATE (PF) 100 MCG/2ML IJ SOLN: 25.0000 ug | INTRAMUSCULAR | Status: DC | PRN

## 2018-01-03 MED ORDER — LIDOCAINE HCL (CARDIAC) PF 100 MG/5ML IV SOSY
PREFILLED_SYRINGE | INTRAVENOUS | Status: DC | PRN
  Administered 2018-01-03: 50 mg via INTRATRACHEAL

## 2018-01-03 MED ORDER — PROPOFOL 10 MG/ML IV BOLUS
INTRAVENOUS | Status: DC | PRN
  Administered 2018-01-03: 120 mg via INTRAVENOUS

## 2018-01-03 MED ORDER — LACTATED RINGERS IV SOLN
Freq: Once | INTRAVENOUS | Status: AC
  Administered 2018-01-03: 14:00:00 via INTRAVENOUS

## 2018-01-03 MED ORDER — SODIUM CHLORIDE 0.9 % IR SOLN
Status: DC | PRN
  Administered 2018-01-03: 3000 mL via INTRAVESICAL

## 2018-01-03 MED ORDER — MIDAZOLAM HCL 2 MG/2ML IJ SOLN
INTRAMUSCULAR | Status: AC
  Filled 2018-01-03: qty 2

## 2018-01-03 MED ORDER — ONDANSETRON HCL 4 MG/2ML IJ SOLN
INTRAMUSCULAR | Status: DC | PRN
  Administered 2018-01-03: 4 mg via INTRAVENOUS

## 2018-01-03 SURGICAL SUPPLY — 26 items
BAG URO CATCHER STRL LF (MISCELLANEOUS) ×3 IMPLANT
BASKET LASER NITINOL 1.9FR (BASKET) IMPLANT
BSKT STON RTRVL 120 1.9FR (BASKET)
CATH INTERMIT  6FR 70CM (CATHETERS) ×3 IMPLANT
CLOTH BEACON ORANGE TIMEOUT ST (SAFETY) IMPLANT
COVER FOOTSWITCH UNIV (MISCELLANEOUS) IMPLANT
COVER SURGICAL LIGHT HANDLE (MISCELLANEOUS) IMPLANT
EXTRACTOR STONE 1.7FRX115CM (UROLOGICAL SUPPLIES) IMPLANT
FIBER LASER FLEXIVA 1000 (UROLOGICAL SUPPLIES) IMPLANT
FIBER LASER FLEXIVA 365 (UROLOGICAL SUPPLIES) IMPLANT
FIBER LASER FLEXIVA 550 (UROLOGICAL SUPPLIES) IMPLANT
FIBER LASER TRAC TIP (UROLOGICAL SUPPLIES) IMPLANT
GLOVE BIOGEL M STRL SZ7.5 (GLOVE) ×9 IMPLANT
GOWN STRL REUS W/TWL LRG LVL3 (GOWN DISPOSABLE) ×6 IMPLANT
GUIDEWIRE ANG ZIPWIRE 038X150 (WIRE) ×3 IMPLANT
GUIDEWIRE STR DUAL SENSOR (WIRE) ×3 IMPLANT
IV NS 1000ML (IV SOLUTION)
IV NS 1000ML BAXH (IV SOLUTION) IMPLANT
MANIFOLD NEPTUNE II (INSTRUMENTS) ×3 IMPLANT
PACK CYSTO (CUSTOM PROCEDURE TRAY) ×3 IMPLANT
SHEATH URETERAL 12FRX28CM (UROLOGICAL SUPPLIES) IMPLANT
SHEATH URETERAL 12FRX35CM (MISCELLANEOUS) IMPLANT
SYR CONTROL 10ML LL (SYRINGE) IMPLANT
TUBE FEEDING 8FR 16IN STR KANG (MISCELLANEOUS) ×3 IMPLANT
TUBING CONNECTING 10 (TUBING) ×2 IMPLANT
TUBING CONNECTING 10' (TUBING) ×1

## 2018-01-03 NOTE — Brief Op Note (Signed)
01/03/2018  4:38 PM  PATIENT:  Victoria Singh  52 y.o. female  PRE-OPERATIVE DIAGNOSIS:  LEFT URETERAL STONE  POST-OPERATIVE DIAGNOSIS:  left ureteral stone  PROCEDURE:  Procedure(s): CYSTOSCOP/YDIAGNOSTIC URETEROSCOPY/STENT REMOVAL (Left)  SURGEON:  Surgeon(s) and Role:    Alexis Frock, MD - Primary  PHYSICIAN ASSISTANT:   ASSISTANTS: none   ANESTHESIA:   general  EBL:  minimal   BLOOD ADMINISTERED:none  DRAINS: none   LOCAL MEDICATIONS USED:  NONE  SPECIMEN:  No Specimen  DISPOSITION OF SPECIMEN:  N/A  COUNTS:  YES  TOURNIQUET:  * No tourniquets in log *  DICTATION: .Other Dictation: Dictation Number [id number]  PLAN OF CARE: Discharge to home after PACU  PATIENT DISPOSITION:  PACU - hemodynamically stable.   Delay start of Pharmacological VTE agent (>24hrs) due to surgical blood loss or risk of bleeding: not applicable

## 2018-01-03 NOTE — Anesthesia Postprocedure Evaluation (Signed)
Anesthesia Post Note  Patient: Victoria Singh  Procedure(s) Performed: CYSTOSCOP/YDIAGNOSTIC URETEROSCOPY/STENT REMOVAL (Left )     Patient location during evaluation: PACU Anesthesia Type: General Level of consciousness: awake and alert Pain management: pain level controlled Vital Signs Assessment: post-procedure vital signs reviewed and stable Respiratory status: spontaneous breathing, nonlabored ventilation, respiratory function stable and patient connected to nasal cannula oxygen Cardiovascular status: blood pressure returned to baseline and stable Postop Assessment: no apparent nausea or vomiting Anesthetic complications: no    Last Vitals:  Vitals:   01/03/18 1736 01/03/18 1804  BP: (!) 140/92 130/77  Pulse: 68   Resp: 15 15  Temp: 36.6 C   SpO2: 99% 99%    Last Pain:  Vitals:   01/03/18 1748  TempSrc:   PainSc: 4                  Brayden Brodhead S

## 2018-01-03 NOTE — Discharge Instructions (Signed)
1 - You may have urinary urgency (bladder spasms) and bloody urine on / off x few days. This is normal. ° °2 - Call MD or go to ER for fever >102, severe pain / nausea / vomiting not relieved by medications, or acute change in medical status ° °

## 2018-01-03 NOTE — Anesthesia Procedure Notes (Signed)
Procedure Name: LMA Insertion Date/Time: 01/03/2018 4:12 PM Performed by: Glory Buff, CRNA Pre-anesthesia Checklist: Patient identified, Emergency Drugs available, Suction available and Patient being monitored Patient Re-evaluated:Patient Re-evaluated prior to induction Oxygen Delivery Method: Circle system utilized Preoxygenation: Pre-oxygenation with 100% oxygen Induction Type: IV induction LMA: LMA inserted LMA Size: 4.0 Number of attempts: 1 Placement Confirmation: positive ETCO2 Tube secured with: Tape Dental Injury: Teeth and Oropharynx as per pre-operative assessment

## 2018-01-03 NOTE — Anesthesia Preprocedure Evaluation (Addendum)
Anesthesia Evaluation  Patient identified by MRN, date of birth, ID band Patient awake    Reviewed: Allergy & Precautions, NPO status , Patient's Chart, lab work & pertinent test results  History of Anesthesia Complications (+) PONV  Airway Mallampati: II  TM Distance: >3 FB Neck ROM: Full    Dental no notable dental hx.    Pulmonary neg pulmonary ROS,    Pulmonary exam normal breath sounds clear to auscultation       Cardiovascular negative cardio ROS Normal cardiovascular exam Rhythm:Regular Rate:Normal     Neuro/Psych negative neurological ROS  negative psych ROS   GI/Hepatic negative GI ROS, Neg liver ROS,   Endo/Other  negative endocrine ROS  Renal/GU negative Renal ROS  negative genitourinary   Musculoskeletal  (+) Arthritis , Rheumatoid disorders,    Abdominal   Peds negative pediatric ROS (+)  Hematology negative hematology ROS (+)   Anesthesia Other Findings   Reproductive/Obstetrics negative OB ROS                             Anesthesia Physical Anesthesia Plan  ASA: II  Anesthesia Plan: General   Post-op Pain Management:    Induction: Intravenous  PONV Risk Score and Plan: 4 or greater and Ondansetron, Dexamethasone, Scopolamine patch - Pre-op, Midazolam and Treatment may vary due to age or medical condition  Airway Management Planned: LMA  Additional Equipment:   Intra-op Plan:   Post-operative Plan: Extubation in OR  Informed Consent: I have reviewed the patients History and Physical, chart, labs and discussed the procedure including the risks, benefits and alternatives for the proposed anesthesia with the patient or authorized representative who has indicated his/her understanding and acceptance.   Dental advisory given  Plan Discussed with: CRNA and Surgeon  Anesthesia Plan Comments:         Anesthesia Quick Evaluation

## 2018-01-03 NOTE — Transfer of Care (Signed)
Immediate Anesthesia Transfer of Care Note  Patient: Victoria Singh  Procedure(s) Performed: CYSTOSCOP/YDIAGNOSTIC URETEROSCOPY/STENT REMOVAL (Left )  Patient Location: PACU  Anesthesia Type:General  Level of Consciousness: awake, alert  and oriented  Airway & Oxygen Therapy: Patient Spontanous Breathing and Patient connected to face mask oxygen  Post-op Assessment: Report given to RN and Post -op Vital signs reviewed and stable  Post vital signs: Reviewed and stable  Last Vitals:  Vitals Value Taken Time  BP    Temp    Pulse    Resp    SpO2      Last Pain:  Vitals:   01/03/18 1333  TempSrc:   PainSc: 0-No pain         Complications: No apparent anesthesia complications

## 2018-01-03 NOTE — H&P (Signed)
Victoria Singh is an 52 y.o. female.    Chief Complaint: Pre-op LEFT Ureteroscopic Stone Manipulation  HPI:   1 - Left Ureteral Stone - 22mm left distal stone with mod hydro by ER CT 11/2017 on eval flank pain. Stone is solitary, 83mm at UVJ. Underwent urgetn stentign last onth to decompress kidney in setting of infection.   2 - Urosepsis / Obstructing Pyelonephritis - new fevers to 104 with bacteruria during ER eval for left ureteral stone. Sig perinephric stranding but no gas in renal parnechyma or peri-nephric abscess. She has relative immune suppression on methotrexate for rheumatoid. UCX pansensitive e. Coli from 11/2017, FU UCX 12/2017 NEGATIVE.   PMH sig for RA on methotrexate, tubal, lap ovarian cyst excision. NO blood thinners or ischemic CV disease. She gets most primary care through her GYN Dr. Nori Riis. Does see rheumatology.   Today "Victoria Singh" is seen to proceed with LEFT ureteroscopic stone manipulation. NO interval fevers.    Past Medical History:  Diagnosis Date  . Anemia   . Complication of anesthesia   . Personal history of colonic polyp - adenoma 01/30/2014  . Pneumonia   . PONV (postoperative nausea and vomiting)    Specifically with morphine  . Rheumatoid arthritis [facility])     Past Surgical History:  Procedure Laterality Date  . CYSTOSCOPY W/ URETERAL STENT PLACEMENT Left 12/10/2017   Procedure: CYSTOSCOPY WITH RETROGRADE PYELOGRAM/URETERAL STENT PLACEMENT;  Surgeon: Alexis Frock, MD;  Location: [place];  Service: Urology;  Laterality: Left;  . DIAGNOSTIC LAPAROSCOPY  1992  . TUBAL LIGATION  2004    Family History  Problem Relation Age of Onset  . Colon cancer Neg Hx    Social History:  reports that she has never smoked. She has never used smokeless tobacco. She reports that she does not drink alcohol or use drugs.  Allergies:  Allergies  Allergen Reactions  . Nitrofurantoin Anaphylaxis and Itching  . Sulfa Antibiotics Itching and Swelling    No medications prior  to admission.    No results found for this or any previous visit (from the past 48 hour(s)). No results found.  Review of Systems  Constitutional: Negative.  Negative for fever.  HENT: Negative.   Eyes: Negative.   Respiratory: Negative.   Cardiovascular: Negative.   Gastrointestinal: Negative.   Genitourinary: Negative.   Musculoskeletal: Negative.   Skin: Negative.   Neurological: Negative.   Endo/Heme/Allergies: Negative.   Psychiatric/Behavioral: Negative.     Last menstrual period 01/04/2014. Physical Exam  Constitutional: She appears well-developed.  HENT:  Head: Normocephalic.  Cardiovascular: Normal rate.  Respiratory: Effort normal.  GI: Soft.  Genitourinary:  Genitourinary Comments: No CVAT at present.   Musculoskeletal: Normal range of motion.  Neurological: She is alert.  Skin: Skin is warm.  Psychiatric: She has a normal mood and affect.     Assessment/Plan  Proceed as planned with LEFT ureteroscopic stone manipulation. Risks, benefits, alternatives, expected peri-op course discussed preivously and reiteratred today.   Alexis Frock, MD 01/03/2018, 11:58 AM

## 2018-01-04 ENCOUNTER — Encounter (HOSPITAL_COMMUNITY): Payer: Self-pay | Admitting: Urology

## 2018-01-04 NOTE — Op Note (Signed)
NAME: Victoria Singh, Victoria Singh MEDICAL RECORD [id number] ACCOUNT [id number] DATE OF BIRTH:December 25, 1965 FACILITY: [facility] LOCATION: [facility] PHYSICIAN:Fredi Hurtado, MD  OPERATIVE REPORT  DATE OF PROCEDURE:  01/03/2018  PREOPERATIVE DIAGNOSIS:  Left distal ureteral stone, history of urosepsis.  POSTOPERATIVE DIAGNOSIS:  Left distal ureteral stone with interval passage.  PROCEDURE PERFORMED: 1.  Cystoscopy, left retrograde pyelogram, interpretation. 2.  Removal of left ureteral stent. 3.  Left diagnostic ureteroscopy.  ESTIMATED BLOOD LOSS:  Nil.  COMPLICATIONS:  None.  SPECIMEN:  Left ureteral stent for discard.  FINDINGS: 1.  No evidence of intraluminal ureteral or renal stone. 2.  Minimal ureteral inflammation.  Decision was made not to replace the stent.  INDICATIONS:  The patient is a pleasant 52 year old lady with history of immune compromise who had a bout of obstructing urosepsis last month due to a distal ureteral stone and pansensitive E. coli pyelonephritis.  She underwent stenting at that time as  a temporizing measure and renal decompression.  She was managed with intravenous antibiotics, bridged to oral therapy, and has since cleared her infectious parameters.  She now presents today for definitive stone management.  She denies interval stone  passage.  Most recent culture is negative.  Informed consent was obtained and placed in the medical record.  DESCRIPTION OF PROCEDURE:  The patient was identified, procedure being left ureteroscopy.  Her stone was confirmed.  The procedure was carried out.  The aforementioned antibiotics were administered.  General LMA anesthesia was induced.  The patient was  placed in the low lithotomy position.  A sterile field was created by prepping and draping the vagina, introitus, and proximal thighs using iodine.  A cystourethroscopy was performed using a 20-French rigid cystoscope with offset lens.  Inspection of the  bladder did reveal a distal  left ureteral stent in situ.  No papillary lesions or calcifications noted.  The distal stent was grasped, brought to the level of the urethral meatus.  There was a 0.038 ZIPwire that was advanced to the upper pole and  exchanged for an open-ended catheter, and left retrograde pyelogram was obtained.  Left retrograde pyelogram demonstrated a single left ureter with a single-system left kidney.  No obvious filling defects or narrowing noted.  There was mild caliectasis seen.  The ZIPwire was once again advanced to the upper pole and set aside as a  safety wire.  An 8-French feeding tube was placed in the urinary bladder for pressure relief, and semirigid ureteroscopy was performed in the distal 2/3 of the left ureter alongside a separate sensor working wire.  No capsular abnormalities or  intraluminal stones were seen whatsoever.  There was some mild inflammation in the distal ureter consistent with likely prior site of stone impaction, but no obvious stone at the site.  Given the goal today was to confirm stone free, semirigid scope was  exchanged for a flexible digital ureteroscope via a sensor working wire using continuous fluoroscopic guidance to the level of the upper pole, and systematic inspection was performed of the left kidney, including all calices x3.  There was no evidence of  intraluminal stones seen whatsoever within all accessible portions of the left kidney.  The ureteroscope was then removed under continuous vision, and again no mucosal abnormalities or intraluminal stone was seen whatsoever, this thereby confirming  interval passage of a small ureteral stone.  Given nonaccess sheath usage and minimal evidence of ongoing ureteral obstruction, the decision was made not to place a stent.  Bladder was empty per cystoscope.  Procedure was terminated.  The patient  tolerated the procedure well.  No immediate complications.  The patient was taken to postanesthesia care in stable  condition.  LN/NUANCE  D:01/03/2018 T:01/04/2018 JOB:[id number]

## 2018-06-18 ENCOUNTER — Other Ambulatory Visit: Payer: Self-pay | Admitting: Obstetrics & Gynecology

## 2018-06-18 DIAGNOSIS — Z1231 Encounter for screening mammogram for malignant neoplasm of breast: Secondary | ICD-10-CM

## 2018-07-24 ENCOUNTER — Ambulatory Visit: Payer: BC Managed Care – PPO

## 2018-08-22 ENCOUNTER — Inpatient Hospital Stay: Admission: RE | Admit: 2018-08-22 | Payer: BC Managed Care – PPO | Source: Ambulatory Visit

## 2018-09-03 ENCOUNTER — Ambulatory Visit
Admission: RE | Admit: 2018-09-03 | Discharge: 2018-09-03 | Disposition: A | Discharge status: Home / Self Care | Payer: BC Managed Care – PPO | Source: Ambulatory Visit | Attending: Obstetrics & Gynecology | Admitting: Obstetrics & Gynecology

## 2018-09-03 DIAGNOSIS — Z1231 Encounter for screening mammogram for malignant neoplasm of breast: Secondary | ICD-10-CM

## 2019-08-12 ENCOUNTER — Other Ambulatory Visit: Payer: Self-pay | Admitting: Obstetrics & Gynecology

## 2019-08-12 DIAGNOSIS — Z1231 Encounter for screening mammogram for malignant neoplasm of breast: Secondary | ICD-10-CM

## 2019-09-25 ENCOUNTER — Ambulatory Visit
Admission: RE | Admit: 2019-09-25 | Discharge: 2019-09-25 | Disposition: A | Discharge status: Home / Self Care | Payer: BC Managed Care – PPO | Source: Ambulatory Visit | Attending: Obstetrics & Gynecology | Admitting: Obstetrics & Gynecology

## 2019-09-25 ENCOUNTER — Other Ambulatory Visit: Payer: Self-pay

## 2019-09-25 DIAGNOSIS — Z1231 Encounter for screening mammogram for malignant neoplasm of breast: Secondary | ICD-10-CM

## 2020-10-30 ENCOUNTER — Other Ambulatory Visit: Payer: Self-pay | Admitting: Obstetrics & Gynecology

## 2020-10-30 DIAGNOSIS — Z1231 Encounter for screening mammogram for malignant neoplasm of breast: Secondary | ICD-10-CM

## 2020-12-23 ENCOUNTER — Other Ambulatory Visit: Payer: Self-pay

## 2020-12-23 ENCOUNTER — Ambulatory Visit
Admission: RE | Admit: 2020-12-23 | Discharge: 2020-12-23 | Disposition: A | Discharge status: Home / Self Care | Payer: BC Managed Care – PPO | Source: Ambulatory Visit | Attending: Obstetrics & Gynecology | Admitting: Obstetrics & Gynecology

## 2020-12-23 DIAGNOSIS — Z1231 Encounter for screening mammogram for malignant neoplasm of breast: Secondary | ICD-10-CM

## 2021-03-17 ENCOUNTER — Other Ambulatory Visit (HOSPITAL_COMMUNITY): Payer: Self-pay | Admitting: Family Medicine

## 2021-03-17 ENCOUNTER — Other Ambulatory Visit: Payer: Self-pay

## 2021-03-17 ENCOUNTER — Ambulatory Visit (HOSPITAL_COMMUNITY)
Admission: RE | Admit: 2021-03-17 | Discharge: 2021-03-17 | Disposition: A | Discharge status: Home / Self Care | Payer: BC Managed Care – PPO | Source: Ambulatory Visit | Attending: Internal Medicine | Admitting: Internal Medicine

## 2021-03-17 DIAGNOSIS — M79662 Pain in left lower leg: Secondary | ICD-10-CM | POA: Diagnosis present

## 2021-03-17 DIAGNOSIS — R600 Localized edema: Secondary | ICD-10-CM

## 2021-05-21 ENCOUNTER — Encounter: Payer: Self-pay | Admitting: Internal Medicine

## 2021-05-23 ENCOUNTER — Encounter: Payer: Self-pay | Admitting: Internal Medicine

## 2021-08-10 ENCOUNTER — Other Ambulatory Visit: Payer: Self-pay | Admitting: Obstetrics & Gynecology

## 2021-08-10 DIAGNOSIS — Z1231 Encounter for screening mammogram for malignant neoplasm of breast: Secondary | ICD-10-CM

## 2021-12-24 ENCOUNTER — Ambulatory Visit
Admission: RE | Admit: 2021-12-24 | Discharge: 2021-12-24 | Disposition: A | Discharge status: Home / Self Care | Payer: BC Managed Care – PPO | Source: Ambulatory Visit | Attending: Obstetrics & Gynecology | Admitting: Obstetrics & Gynecology

## 2021-12-24 DIAGNOSIS — Z1231 Encounter for screening mammogram for malignant neoplasm of breast: Secondary | ICD-10-CM

## 2022-06-10 IMAGING — MG MM DIGITAL SCREENING BILAT W/ TOMO AND CAD
8 series · 8 of 24 positions shown · non-contrast
Comparison: Previous exam(s).

CLINICAL DATA: Screening.

EXAM:
DIGITAL SCREENING BILATERAL MAMMOGRAM WITH TOMOSYNTHESIS AND CAD
TECHNIQUE: Bilateral screening digital craniocaudal and mediolateral oblique
mammograms were obtained. Bilateral screening digital breast
tomosynthesis was performed. The images were evaluated with
computer-aided detection.

[R CC synth-2D]
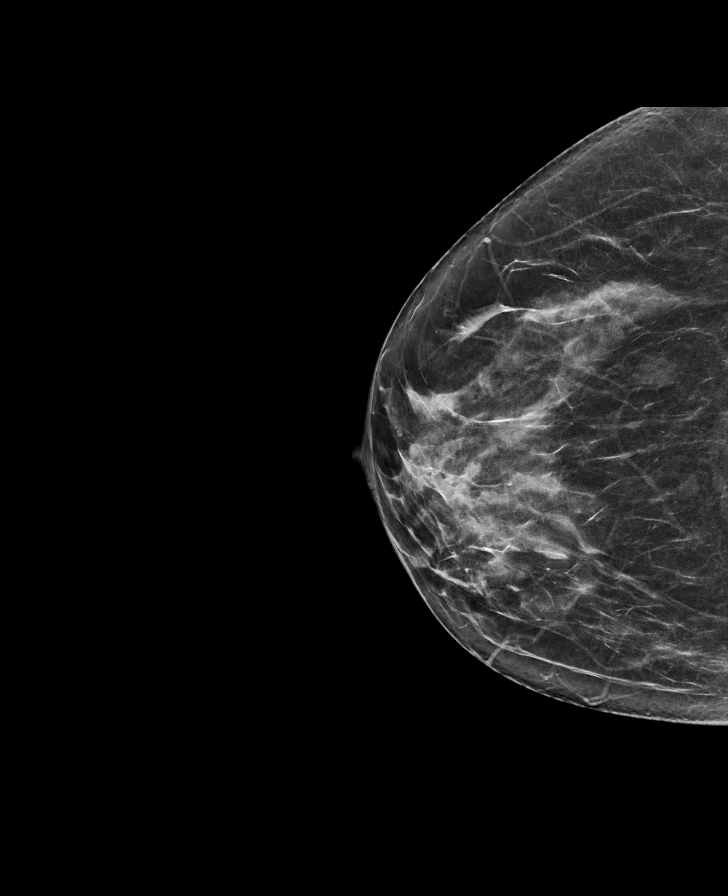

[L CC synth-2D]
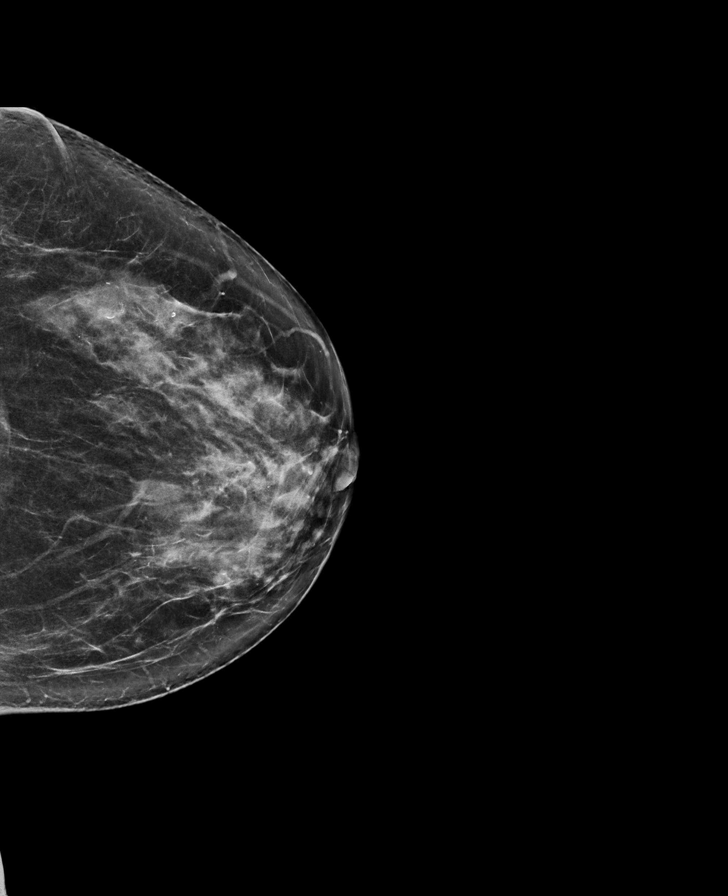

[L MLO synth-2D]
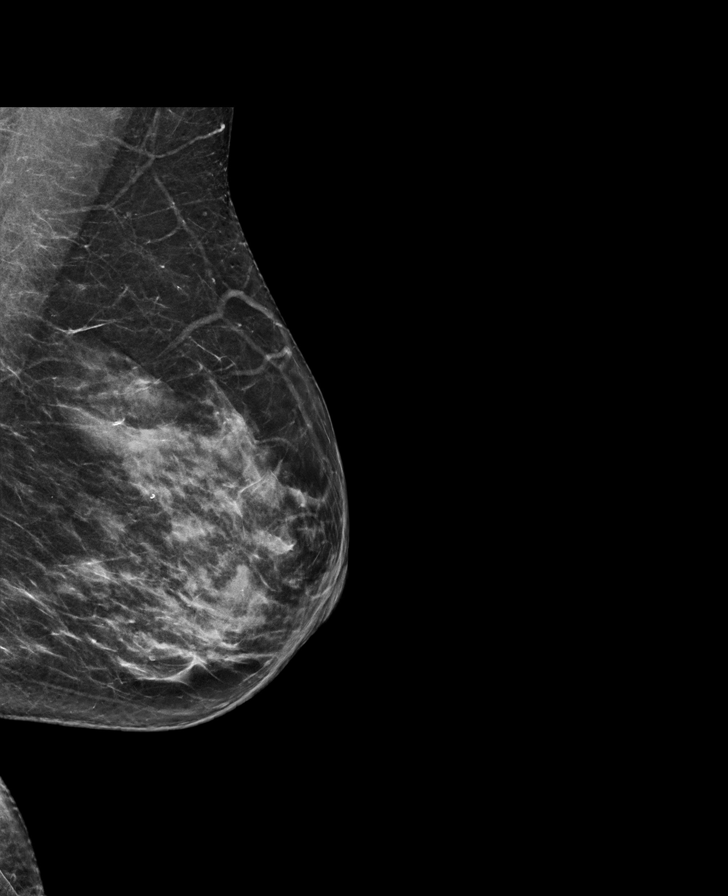

[R MLO synth-2D]
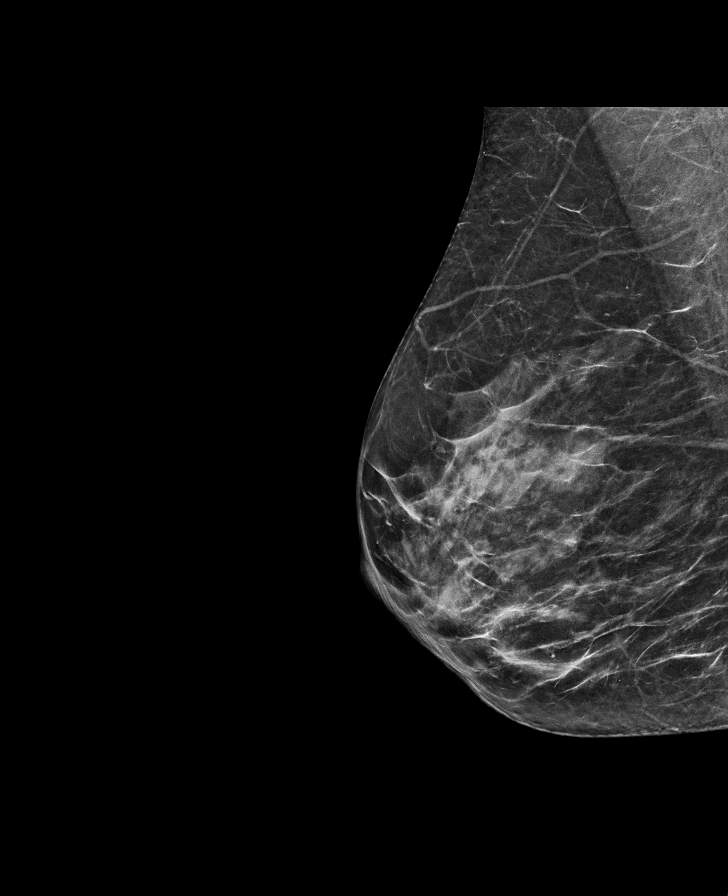

[L CC tomo · tomo slice 35/70.0]
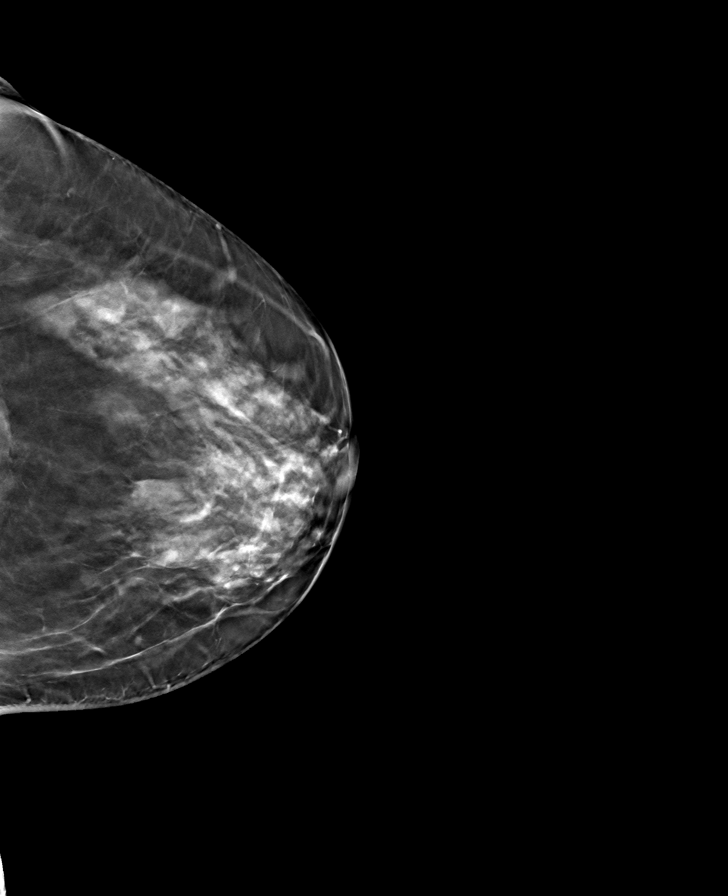

[R MLO tomo · tomo slice 33/64.0]
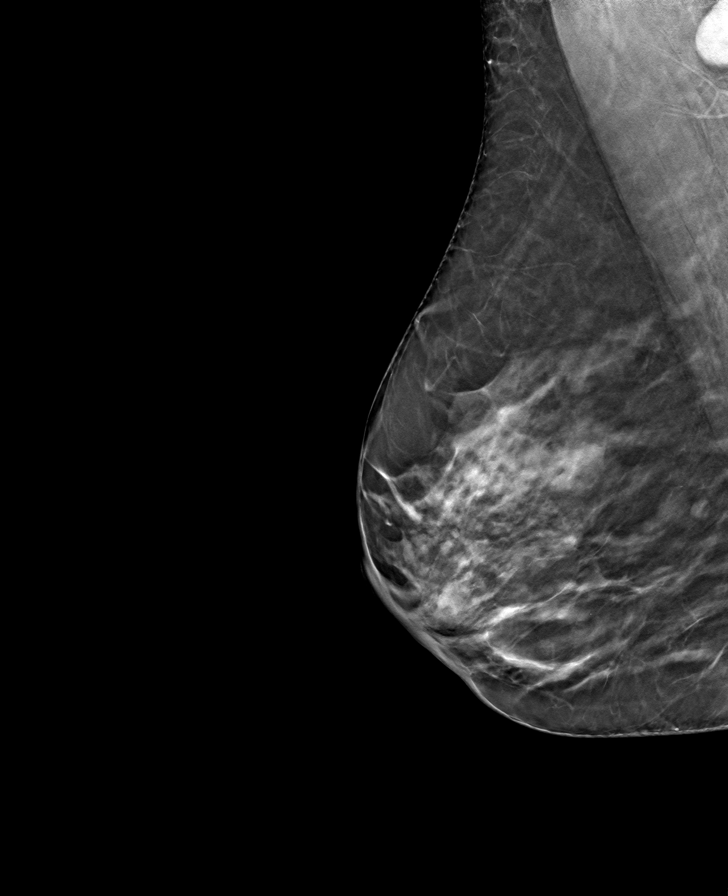

[R CC tomo · tomo slice 33/66.0]
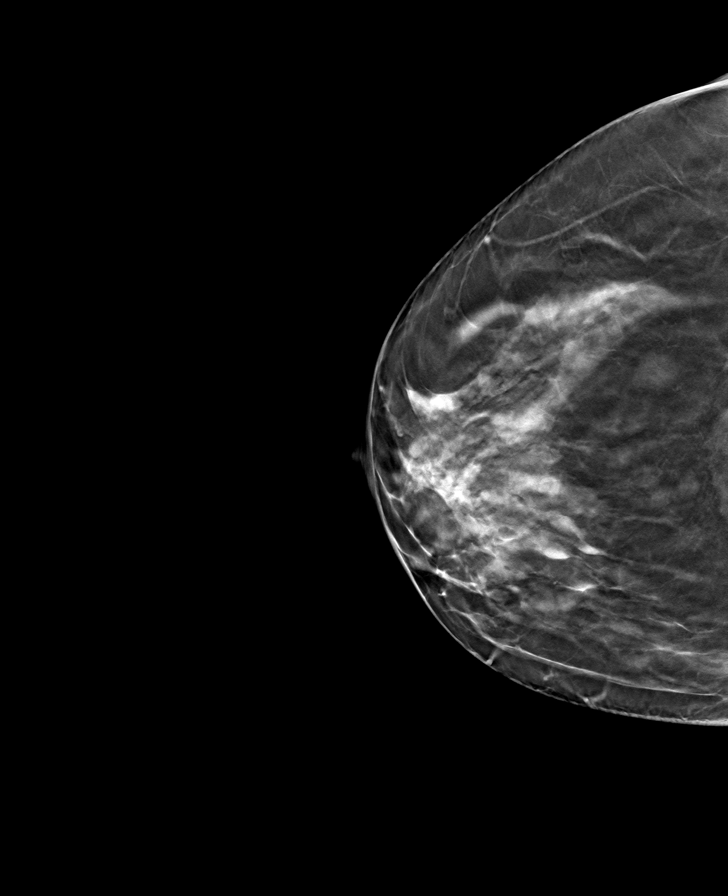

[L MLO tomo · tomo slice 35/69.0]
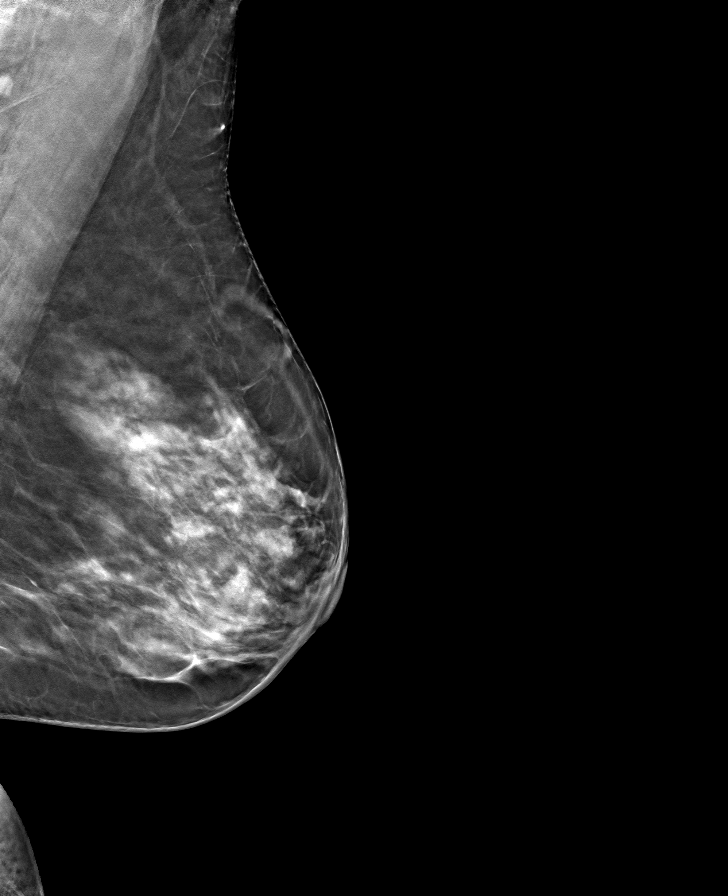

[8 of 24 positions shown; findings below may reference images not displayed]

ACR Breast Density Category c: The breast tissue is heterogeneously
dense, which may obscure small masses.
FINDINGS: There are no findings suspicious for malignancy.
IMPRESSION: No mammographic evidence of malignancy. A result letter of this
screening mammogram will be mailed directly to the patient.

RECOMMENDATION:
Screening mammogram in one year. (Code:Q3-W-BC3)

BI-RADS CATEGORY  1: Negative.

## 2022-12-13 ENCOUNTER — Other Ambulatory Visit: Payer: Self-pay

## 2022-12-13 DIAGNOSIS — Z1231 Encounter for screening mammogram for malignant neoplasm of breast: Secondary | ICD-10-CM

## 2022-12-27 ENCOUNTER — Ambulatory Visit
Admission: RE | Admit: 2022-12-27 | Discharge: 2022-12-27 | Disposition: A | Discharge status: Home / Self Care | Payer: BC Managed Care – PPO | Source: Ambulatory Visit | Attending: Obstetrics and Gynecology | Admitting: Obstetrics and Gynecology

## 2022-12-27 DIAGNOSIS — Z1231 Encounter for screening mammogram for malignant neoplasm of breast: Secondary | ICD-10-CM

## 2023-03-17 ENCOUNTER — Other Ambulatory Visit (HOSPITAL_COMMUNITY): Payer: Self-pay | Admitting: Family Medicine

## 2023-03-17 ENCOUNTER — Ambulatory Visit (HOSPITAL_COMMUNITY)
Admission: RE | Admit: 2023-03-17 | Discharge: 2023-03-17 | Disposition: A | Discharge status: Home / Self Care | Payer: BC Managed Care – PPO | Source: Ambulatory Visit | Attending: Family Medicine | Admitting: Family Medicine

## 2023-03-17 DIAGNOSIS — M79605 Pain in left leg: Secondary | ICD-10-CM | POA: Insufficient documentation

## 2023-03-17 DIAGNOSIS — M7989 Other specified soft tissue disorders: Secondary | ICD-10-CM | POA: Diagnosis not present

## 2023-03-17 NOTE — Progress Notes (Signed)
Left lower extremity venous duplex has been completed. Preliminary results can be found in CV Proc through chart review.  Results were faxed to Dr. Althea Charon.  03/17/23 2:31 PM Olen Cordial RVT

## 2023-12-07 ENCOUNTER — Other Ambulatory Visit: Payer: Self-pay | Admitting: Obstetrics and Gynecology

## 2023-12-07 DIAGNOSIS — Z1231 Encounter for screening mammogram for malignant neoplasm of breast: Secondary | ICD-10-CM

## 2023-12-29 ENCOUNTER — Ambulatory Visit
Admission: RE | Admit: 2023-12-29 | Discharge: 2023-12-29 | Disposition: A | Discharge status: Home / Self Care | Payer: Self-pay | Source: Ambulatory Visit | Attending: Obstetrics and Gynecology | Admitting: Obstetrics and Gynecology

## 2023-12-29 DIAGNOSIS — Z1231 Encounter for screening mammogram for malignant neoplasm of breast: Secondary | ICD-10-CM

## 2024-07-09 ENCOUNTER — Encounter: Payer: Self-pay | Admitting: Internal Medicine

## 2024-07-09 ENCOUNTER — Other Ambulatory Visit: Payer: Self-pay

## 2024-07-09 ENCOUNTER — Ambulatory Visit: Admitting: Internal Medicine

## 2024-07-09 VITALS — BP 92/66 | HR 110 | Temp 98.1°F | Ht 64.17 in | Wt 152.9 lb

## 2024-07-09 DIAGNOSIS — L230 Allergic contact dermatitis due to metals: Secondary | ICD-10-CM | POA: Diagnosis not present

## 2024-07-09 NOTE — Progress Notes (Signed)
 "  NEW PATIENT  Date of Service/Encounter:  07/09/2024  Consult requested by: Joshua Francisco, MD   Subjective:   Victoria Singh (DOB: 12-12-1965) is a 58 y.o. female who presents to the clinic on 07/09/2024 with a chief complaint of Allergy Testing (Nickel) and Establish Care .    History obtained from: chart review and patient.   Notes history of itchy rash on neck when she wears necklaces.  Sometimes fingers turn blue.  Overall not sure if nickel allergic because she does not wear jewelry much. Had oral prednisone to use PRN for RA; last use was over a year ago.  On chronic immunosuppression with leflunomide  nad methotrexate. Has foreign material in body- dental.      Reviewed:  02/01/2024: seen by Rheum Dr Ziolkowska for RA on leflunomide  and PRN prednisone.   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   Past Medical History: Past Medical History:  Diagnosis Date   Anemia    Complication of anesthesia    Personal history of colonic polyp - adenoma 01/30/2014   Pneumonia    PONV (postoperative nausea and vomiting)    Specifically with morphine    Rheumatoid arthritis (HCC)     Past Surgical History: Past Surgical History:  Procedure Laterality Date   CYSTOSCOPY W/ URETERAL STENT PLACEMENT Left 12/10/2017   Procedure: CYSTOSCOPY WITH RETROGRADE PYELOGRAM/URETERAL STENT PLACEMENT;  Surgeon: Alvaro Hummer, MD;  Location: [facility];  Service: Urology;  Laterality: Left;   CYSTOSCOPY/URETEROSCOPY/HOLMIUM LASER/STENT PLACEMENT Left 01/03/2018   Procedure: CYSTOSCOP/YDIAGNOSTIC URETEROSCOPY/STENT REMOVAL;  Surgeon: Alvaro Hummer, MD;  Location: [facility];  Service: Urology;  Laterality: Left;   DIAGNOSTIC LAPAROSCOPY  1992   TUBAL LIGATION  2004    Family History: Family History  Problem Relation Age of Onset   Colon cancer Neg Hx    Medication List:  Allergies as of 07/09/2024       Reactions   Nitrofurantoin Anaphylaxis, Itching   Sulfa  Antibiotics Itching, Swelling        Medication List        Accurate as of July 09, 2024  3:08 PM. If you have any questions, ask your nurse or doctor.          acetaminophen  500 MG tablet Commonly known as: TYLENOL  Take 1,000 mg by mouth every 6 (six) hours as needed (for pain.).   B-12 PO Take 1 tablet by mouth daily with lunch.   Cholecalciferol 50 MCG (2000 UT) Tabs Take by mouth.   ciclopirox 8 % solution Commonly known as: PENLAC Apply topically once.   CRANBERRY PO Take 1 tablet by mouth daily with lunch.   folic acid  1 MG tablet Commonly known as: FOLVITE  Take 1 mg by mouth daily with lunch.   HYDROcodone -acetaminophen  5-325 MG tablet Commonly known as: NORCO/VICODIN Take 1 tablet by mouth every 6 (six) hours as needed for severe pain. Post-operatively   leflunomide  10 MG tablet Commonly known as: ARAVA  Take 10 mg by mouth daily.   methotrexate 2.5 MG tablet Commonly known as: RHEUMATREX Take 7.5 mg by mouth every Monday. Caution:Chemotherapy. Protect from light. Takes 3 tablets once a week   multivitamin with minerals Tabs tablet Take 1 tablet by mouth daily with lunch.   ondansetron  4 MG tablet Commonly known as: ZOFRAN  Take 1 tablet (4 mg total) by mouth every 6 (six) hours as needed for nausea.         REVIEW OF SYSTEMS: Pertinent positives and negatives  discussed in HPI.   Objective:   Physical Exam: BP 92/66 (BP Location: Right Arm, Patient Position: Sitting, Cuff Size: Normal)   Pulse (!) 110   Temp 98.1 F (36.7 C) (Temporal)   Ht 5' 4.17 (1.63 m)   Wt 152 lb 14.4 oz (69.4 kg)   LMP 01/04/2014   SpO2 97%   BMI 26.10 kg/m  Body mass index is 26.1 kg/m. 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 Positive patch testing results can help in avoiding those items; however, the sensitivity of patch testing can range from 60-80% and therefore false negative results are possible.  Therefore, this does not rule out a metal allergy.  Nevertheless, this is the most accessible test for metal sensitivity currently available. Furthermore, you are on an immunosuppressant which can possibly result in false negative results.   Please schedule for patch testing. Patches placed on Monday and read on Wednesday, Friday and next Monday. Please avoid strenuous physical activities and do not get the patches on the back wet.  Okay to take antihistamines for itching but avoid placing any creams on the back where the patches are. No steroids for 2 weeks before.   Schedule for metal patch testing.        No follow-ups on file.  Arleta Blanch, MD [facility]        "

## 2024-07-09 NOTE — Patient Instructions (Addendum)
 Concern for contact dermatitis Discussed with patient that patch testing is the test for contact dermatitis and sometimes it does not correlate to how one will react to metals in the body. Positive patch testing results can help in avoiding those items; however, the sensitivity of patch testing can range from 60-80% and therefore false negative results are possible.  Therefore, this does not rule out a metal allergy.  Nevertheless, this is the most accessible test for metal sensitivity currently available. Furthermore, you are on an immunosuppressant which can possibly result in false negative results.   Please schedule for patch testing. Patches placed on Monday and read on Wednesday, Friday and next Monday. Please avoid strenuous physical activities and do not get the patches on the back wet.  Okay to take antihistamines (Zyrtec, Claritin, Allegra, Xyzal) for itching but avoid placing any creams on the back where the patches are. No steroids for 2 weeks before.   Schedule for metal patch testing.

## 2024-08-19 ENCOUNTER — Encounter: Admitting: Family

## 2024-08-21 ENCOUNTER — Encounter: Admitting: Family

## 2024-08-23 ENCOUNTER — Encounter: Admitting: Family Medicine

## 2024-08-26 ENCOUNTER — Encounter: Admitting: Family

## 2024-08-28 ENCOUNTER — Encounter: Admitting: Family

## 2024-08-30 ENCOUNTER — Encounter: Admitting: Family Medicine

## 2024-09-02 ENCOUNTER — Encounter: Admitting: Family Medicine
